# Patient Record
Sex: Female | Born: 1975 | Race: White | Hispanic: Yes | Marital: Married | Smoking: Never smoker
Health system: Southern US, Community
[De-identification: ages and names within clinical notes are randomized; demographics above are authoritative.]

## PROBLEM LIST (undated history)

## (undated) DIAGNOSIS — I10 Essential (primary) hypertension: Secondary | ICD-10-CM

## (undated) DIAGNOSIS — N83209 Unspecified ovarian cyst, unspecified side: Secondary | ICD-10-CM

## (undated) HISTORY — PX: NO PAST SURGERIES: SHX2092

---

## 2005-01-17 ENCOUNTER — Other Ambulatory Visit: Admission: RE | Admit: 2005-01-17 | Discharge: 2005-01-17 | Payer: Self-pay | Admitting: Obstetrics and Gynecology

## 2005-10-12 ENCOUNTER — Inpatient Hospital Stay (HOSPITAL_COMMUNITY): Admission: AD | Admit: 2005-10-12 | Discharge: 2005-10-14 | Payer: Self-pay | Admitting: Gynecology

## 2005-10-12 ENCOUNTER — Ambulatory Visit: Payer: Self-pay | Admitting: Certified Nurse Midwife

## 2011-09-24 ENCOUNTER — Ambulatory Visit: Payer: Self-pay | Admitting: Family Medicine

## 2011-09-24 VITALS — BP 147/88 | HR 71 | Temp 98.6°F | Resp 16 | Ht 63.25 in | Wt 179.4 lb

## 2011-09-24 DIAGNOSIS — I10 Essential (primary) hypertension: Secondary | ICD-10-CM

## 2011-09-24 DIAGNOSIS — R42 Dizziness and giddiness: Secondary | ICD-10-CM

## 2011-09-24 DIAGNOSIS — R112 Nausea with vomiting, unspecified: Secondary | ICD-10-CM

## 2011-09-24 DIAGNOSIS — R110 Nausea: Secondary | ICD-10-CM

## 2011-09-24 DIAGNOSIS — R51 Headache: Secondary | ICD-10-CM

## 2011-09-24 DIAGNOSIS — R635 Abnormal weight gain: Secondary | ICD-10-CM

## 2011-09-24 LAB — POCT CBC
Granulocyte percent: 65.5 %G (ref 37–80)
Lymph, poc: 2.4 (ref 0.6–3.4)
MCHC: 33.8 g/dL (ref 31.8–35.4)
MID (cbc): 0.6 (ref 0–0.9)
POC Granulocyte: 5.7 (ref 2–6.9)
POC LYMPH PERCENT: 27.3 %L (ref 10–50)
Platelet Count, POC: 252 10*3/uL (ref 142–424)
RDW, POC: 13.2 %

## 2011-09-24 LAB — POCT URINE PREGNANCY: Preg Test, Ur: NEGATIVE

## 2011-09-24 MED ORDER — HYDROCHLOROTHIAZIDE 12.5 MG PO CAPS: 12.5000 mg | ORAL_CAPSULE | Freq: Every day | ORAL | Status: DC

## 2011-09-24 NOTE — Progress Notes (Signed)
Subjective:    Patient ID: Kristen Madden, female    DOB: Apr 06, 1976, 36 y.o.   MRN: [id number]  HPI Kristen Madden is a 36 y.o. female Hx of HtN - last seen here in 2010.   Had normal BP's at other clinic on meds, and lost weight- BP was better, now weight has gone up 20 pounds past few months.  No known hx thyroid dz. - had checked a year ago at other office. Today c/o nuasea and dizziness since 1:30 today -  Resolved at 3pm.  Now c/o slight headache top of head and in back of head. Similar HA in past, mild HA. No vision changes, no vomiting, no weakness/slurred speech/facial droop.  Took 3 pills 2 days ago for vaginal irritation/infection/fungus. No f/c.   2-3 weeks ago had chest pain - center chest to back, SOB at the time, but no N/V/diaphoresis.  Sx's resolved in 10-15 minutes after taking Advil.   No recent chest pain.   Irregular periods  -always.  LMP 2/20, and started with menses today. BP high at other clinic few weeks ago.     No known FH CAD.  Review of Systems  Constitutional: Negative for fever and chills.  Cardiovascular: Positive for chest pain.       2 weeks ago - had episode of chest pain.  Genitourinary: Positive for vaginal bleeding.  Neurological: Positive for dizziness.  Psychiatric/Behavioral: Positive for sleep disturbance.       Has been staying up with husband and son due to illnesses past few weeks. Hx of depression in 2007 - denies currently - just tired.         Objective:   Physical Exam  Constitutional: She is oriented to person, place, and time. She appears well-developed and well-nourished.  HENT:  Head: Normocephalic and atraumatic.  Mouth/Throat: No oropharyngeal exudate.       No nystagmus  Eyes: Conjunctivae and EOM are normal. Pupils are equal, round, and reactive to light.  Neck: No thyromegaly present.  Cardiovascular: Normal rate, regular rhythm, normal heart sounds and intact distal pulses.  Exam reveals no gallop.   No  murmur heard. Pulmonary/Chest: Effort normal. She has no wheezes. She has no rales.  Lymphadenopathy:    She has no cervical adenopathy.  Neurological: She is alert and oriented to person, place, and time. She has normal strength. She is not disoriented. No sensory deficit. She displays a negative Romberg sign. Coordination and gait normal.       Normal heel to toe, nonfocal neuro exam.  Skin: Skin is warm and dry. No rash noted.  Psychiatric: She has a normal mood and affect. Her behavior is normal.   EKG: SR, no acute findings.    Results for orders placed in visit on 09/24/11  GLUCOSE, POCT (MANUAL RESULT ENTRY)      Component Value Range   POC Glucose 94    POCT CBC      Component Value Range   WBC 8.7  4.6 - 10.2 (K/uL)   Lymph, poc 2.4  0.6 - 3.4    POC LYMPH PERCENT 27.3  10 - 50 (%L)   MID (cbc) 0.6  0 - 0.9    POC MID % 7.2  0 - 12 (%M)   POC Granulocyte 5.7  2 - 6.9    Granulocyte percent 65.5  37 - 80 (%G)   RBC 4.35  4.04 - 5.48 (M/uL)   Hemoglobin 13.5  12.2 - 16.2 (g/dL)  HCT, POC 40.0  37.7 - 47.9 (%)   MCV 91.9  80 - 97 (fL)   MCH, POC 31.0  27 - 31.2 (pg)   MCHC 33.8  31.8 - 35.4 (g/dL)   RDW, POC 40.9     Platelet Count, POC 252  142 - 424 (K/uL)   MPV 12.1  0 - 99.8 (fL)  POCT URINE PREGNANCY      Component Value Range   Preg Test, Ur Negative         Assessment & Plan:  Kristen Madden is a 36 y.o. female 1. Nausea  POCT glucose (manual entry), POCT CBC, POCT urine pregnancy  2. Dizziness  POCT glucose (manual entry), POCT CBC, TSH, Basic metabolic panel, POCT urine pregnancy, EKG 12-Lead  3. HTN (hypertension)  Basic metabolic panel, EKG 12-Lead  4. Weight gain  TSH   Hx of HTN - recurred with weight gain, prior off meds.  Hx of fleeting sensation of chest pain 2-3 weeks ago without recurrence, and episode of nausea and dizziness earlier today with slight residual HA. Reassuring exam and labs and nonfocal neuro exam, along with improvement in  symptoms - CT deferred at this point.  If any increase in headache, dizziness or nausea, rtc or ER.  If any chest pain, 911/ER eval.  Check TSH and BMP with weight gain and HTN.  Will restart hctz 12.5mg  QD - # 30, 1 rf, recheck next 6 weeks.  orthostatic precautions discussed.    Discussed above in english and spanish, understanding expressed.

## 2011-09-24 NOTE — Patient Instructions (Addendum)
Si su simptomas de dolor en pecho regrese, va a cuarto de emergencia o lllama 911 en telefono.  regrese a Event organiser en 6 semanas por su presion.  Si  tiene Optometrist, regrese mas temprano o cuarto de Associate Professor.

## 2011-09-25 LAB — BASIC METABOLIC PANEL
Calcium: 9.4 mg/dL (ref 8.4–10.5)
Creat: 0.54 mg/dL (ref 0.50–1.10)
Glucose, Bld: 91 mg/dL (ref 70–99)
Sodium: 142 mEq/L (ref 135–145)

## 2011-09-25 LAB — TSH: TSH: 1.65 u[IU]/mL (ref 0.350–4.500)

## 2011-11-17 ENCOUNTER — Encounter: Payer: Self-pay | Admitting: Family Medicine

## 2011-11-17 ENCOUNTER — Ambulatory Visit: Payer: Self-pay | Admitting: Family Medicine

## 2011-11-17 VITALS — BP 147/85 | HR 73 | Temp 98.2°F | Resp 16 | Ht 63.5 in | Wt 177.2 lb

## 2011-11-17 DIAGNOSIS — L282 Other prurigo: Secondary | ICD-10-CM

## 2011-11-17 DIAGNOSIS — R300 Dysuria: Secondary | ICD-10-CM

## 2011-11-17 DIAGNOSIS — B9689 Other specified bacterial agents as the cause of diseases classified elsewhere: Secondary | ICD-10-CM

## 2011-11-17 DIAGNOSIS — I10 Essential (primary) hypertension: Secondary | ICD-10-CM

## 2011-11-17 DIAGNOSIS — N760 Acute vaginitis: Secondary | ICD-10-CM

## 2011-11-17 DIAGNOSIS — N898 Other specified noninflammatory disorders of vagina: Secondary | ICD-10-CM

## 2011-11-17 LAB — POCT URINALYSIS DIPSTICK
Blood, UA: NEGATIVE
Glucose, UA: NEGATIVE
Nitrite, UA: NEGATIVE
Protein, UA: NEGATIVE
Spec Grav, UA: <=1.005
Urobilinogen, UA: 0.2

## 2011-11-17 LAB — POCT UA - MICROSCOPIC ONLY
Crystals, Ur, HPF, POC: NEGATIVE
Yeast, UA: NEGATIVE

## 2011-11-17 LAB — POCT WET PREP WITH KOH
KOH Prep POC: NEGATIVE
Trichomonas, UA: NEGATIVE

## 2011-11-17 MED ORDER — HYDROCHLOROTHIAZIDE 25 MG PO TABS: 25.0000 mg | ORAL_TABLET | Freq: Every day | ORAL | Status: DC

## 2011-11-17 MED ORDER — TRIAMCINOLONE ACETONIDE 0.1 % EX CREA: TOPICAL_CREAM | Freq: Two times a day (BID) | CUTANEOUS | Status: AC

## 2011-11-17 MED ORDER — METRONIDAZOLE 500 MG PO TABS: ORAL_TABLET | ORAL | Status: DC

## 2011-11-17 NOTE — Patient Instructions (Signed)
Dove soap.  No mas vinegar. nuevo medicina for su presion.  regresa en una mes, mas temprano si necesario.   Vaginosis bacteriana (Bacterial Vaginosis) La vaginosis bacteriana es una infeccin vaginal en la que el equilibrio normal de las bacterias de la vagina se modifica. Este equilibrio normal se ve afectado por un desarrollo excesivo de ciertas bacterias. Hay diferentes tipos de bacteria que causan la vaginosis bacteriana. Es el problema vaginal ms comn en las mujeres de edad frtil. CAUSAS  La causa de este trastorno no se conoce bien. Se produce como consecuencia de un aumento o desequilibrio de las bacterias nocivas.   Algunas actividades o conductas pueden poner en peligro el equilibrio normal de las bacterias en la vagina, y Astronomer. Entre ellas:   Tener un compaero sexual o mltiples compaeros sexuales.   Las duchas vaginales   Usar un dispositivo intrauterino (DIU) como mtodo anticonceptivo.   No se conoce el papel que juega la actividad sexual en el desarrollo de [place] VB. Sin embargo, las mujeres que nunca tuvieron relaciones sexuales raramente se infectan.  El contagio no se produce en asientos de baos, camas, piscinas o por tocar objetos.  SNTOMAS  Flujo vaginal grisceo.   Olor parecido al pescado con la secrecin, en especial despus de Management consultant.   Picazn o irritacin de la vagina y la vulva.   Ardor o dolor al [employer].   Algunas mujeres no presentan ningn sntoma.  DIAGNSTICO El mdico realizar un examen vaginal para diagnosticar una vaginosis bacteriana. El mdico le indicar anlisis de laboratorio y observar las muestras del lquido vaginal en el microscopio. Buscar bacterias y clulas anormales (clulas clave), pH mayor a 4.5 y [place] prueba de aminas positivo, todos ellos asociados al BV.  RIESGOS Y COMPLICACIONES  Enfermedad plvica inflamatoria (EPI).   Infecciones luego de una ciruga ginecolgica.   VIH.   Virus  del Herpes  TRATAMIENTO En algunos casos, la infeccin desaparece sin tratamiento. Sin embargo, todas las mujeres con sntomas de VB deben tratarse para evitar complicaciones, especialmente si se ha planificado una ciruga ginecolgica. Los compaeros varones generalmente no necesitan tratamiento. Sin embargo, puede contagiarse entre parejas femeninas, de modo que el tratamiento se realiza para Dietitian.   La VB puede tratarse con medicamentos que destruyen grmenes (antibiticos). Estos se presentan en pldoras o en cremas vaginales. Tanto mujeres embarazadas como no embarazadas pueden usar ambos, pero se indican en dosis diferentes. Estos antibiticos no daan al beb.   La VB puede recurrir [employer]. Si esto ocurre, se prescribir un segundo tratamiento con antibiticos.   El tratamiento es importante en el caso de las mujeres Cub[place]. Si no se trata, la VB puede causar [employer], especialmente en [employer] que ha tenido un parto prematuro en el pasado. Todas las mujeres embarazadas que tienen sntomas de VB deben ser controladas y tratadas.   En los casos de recurrencia crnica, se prescribe un tratamiento con un gel vaginal dos veces por semana  INSTRUCCIONES PARA EL CUIDADO DOMICILIARIO  Tome los medicamentos que le indic el mdico.   No mantenga relaciones sexuales Librarian, academic.   Comunique a sus compaeros sexuales que sufre una infeccin vaginal. Ellos deben concurrir para un control mdico si tienen problemas como una urticaria leve o picazn.   Practique el sexo seguro. Use preservativos. Tenga un solo compaero sexual.  PREVENCIN Algunos pasos bsicos de prevencin pueden ayudar a reducir Nurse, adult de desequilibrio de las bacterias vaginales  y de sufrir VB.  No mantener relaciones sexuales (abstinencia)   No utilice duchas vaginales.   Utilice todos los [employer] han prescripto para el [place], aunque los  sntomas hayan desaparecido.   Comunique a su compaero sexual que sufre una VB. De ese modo podr tratase, si es necesario, y podr Economist.  SOLICITE ATENCIN MDICA SI:  Los sntomas no mejoran luego de [address]  [address] de [place].   Aumentan la secrecin, el dolor o la fiebre.  ASEGRESE QUE:   Comprende estas instrucciones.   Controlar su enfermedad.   Solicitar ayuda de inmediato si no mejora o empeora.  PARA MS INFORMACIN: Division de STD Prevention (DSTDP), Centers for Disease Control and Prevention (Centros para el control y la prevencin de enfermedades, CDC): SolutionApps.co.za American Social Health Association (ASHA): www.ashastd.org  Document Released: 09/30/2007 Document Revised: 06/12/2011 [facility] Patient Information 2012 [place], [place].

## 2011-11-17 NOTE — Progress Notes (Signed)
Subjective:    Patient ID: Jefm Bryant, female    DOB: [birth year], 36 y.o.   MRN: [id number]  HPI AMONI SCALLAN is a 36 y.o. female  HTN - see 09/24/11 office visit.  Restarted hctz 12.5mg  QD - # 30, 1 rf. TSH and creatinine WNL at last ov.  No side effects with meds. No outside BP's.  Denies use of salt, does not eat restaurant food or frozen foods.    Vaginal discharge - green discharge,  For past 6 months.  Sometimes white, sometimes green.  Itching now.  LMP - 8 days go.  No abd/pelvic pain. No urinary difficulty.  Dr. Quintella Reichert gave medicine, but it didn;t work.  Had diflucan 150mg  x 6 days and nystatin in December 2012, no improvement..  Albendazole 200mg  #6 rx in March,   2 other visits - had nystatin cream rx. No apparent improvement with these treatments. Uses water and white vinegar to cleanse genital area.  No other soap.    Pap normal last November.  Itching on arms for few weeks, use cortisone occasionally - helps, but comes back.  Takes Production assistant, radio.   Review of Systems  Constitutional: Negative for fever and chills.  Respiratory: Negative for shortness of breath.   Cardiovascular: Negative for chest pain.  Gastrointestinal: Negative for nausea and abdominal pain.  Genitourinary: Positive for vaginal discharge. Negative for urgency, hematuria, vaginal bleeding, difficulty urinating, vaginal pain and pelvic pain.  Neurological: Negative for dizziness.       Objective:   Physical Exam  Constitutional: She is oriented to person, place, and time. She appears well-developed and well-nourished.  HENT:  Head: Atraumatic.  Cardiovascular: Normal rate, regular rhythm, normal heart sounds and intact distal pulses.   No murmur heard. Pulmonary/Chest: Effort normal and breath sounds normal.  Abdominal: Soft. She exhibits no distension. There is no tenderness. There is no rebound and no guarding.  Genitourinary: Uterus normal. There is no rash on the right labia.  There is no rash on the left labia. Cervix exhibits no motion tenderness. Right adnexum displays no tenderness. Left adnexum displays no tenderness. No erythema, tenderness or bleeding around the vagina. No foreign body around the vagina. Vaginal discharge found.       Small amt white discharge in vault.  Neurological: She is alert and oriented to person, place, and time.  Skin: Skin is warm and dry.     Psychiatric: She has a normal mood and affect. Her behavior is normal.   Results for orders placed in visit on 11/17/11  POCT URINALYSIS DIPSTICK      Component Value Range   Color, UA yellow     Clarity, UA clear     Glucose, UA neg     Bilirubin, UA neg     Ketones, UA neg     Spec Grav, UA <=1.005     Blood, UA neg     pH, UA 6.5     Protein, UA neg     Urobilinogen, UA 0.2     Nitrite, UA neg     Leukocytes, UA Negative    POCT UA - MICROSCOPIC ONLY      Component Value Range   WBC, Ur, HPF, POC 0-1     RBC, urine, microscopic 0-1     Bacteria, U Microscopic neg     Mucus, UA neg     Epithelial cells, urine per micros 0-2     Crystals, Ur, HPF, POC neg  Casts, Ur, LPF, POC neg     Yeast, UA neg    POCT WET PREP WITH KOH      Component Value Range   Trichomonas, UA Negative     Clue Cells Wet Prep HPF POC 4-6     Epithelial Wet Prep HPF POC 5-8     Yeast Wet Prep HPF POC NEG     Bacteria Wet Prep HPF POC 2+     RBC Wet Prep HPF POC 3-6     WBC Wet Prep HPF POC 18-20     KOH Prep POC Negative          Assessment & Plan:  AYMEE FOMBY is a 36 y.o. female HTn - uncontrolled. Increase HCTZ to 25mg  qd.  Orthostatic precautions.  Check outside BP's.  If any new se's at this dose, return to 12.5mg .  Recheck in 1 month.  Vaginal discharge - recurrent - likely Bacterial vaginosis, advised to stop vinegar cleanses.  Dove soap only and water.  Flagyl 150mg  BID #14.    Rash - arms, contact derm vs eczema. Cont allegra otc.  Dove soap.  Triamcinolone.0.1% BID  top prn.  REcheck next few weeks if not improving.  Language barrier - spanish and english spoken.  Understanding expressed.

## 2011-11-24 ENCOUNTER — Ambulatory Visit: Payer: Self-pay | Admitting: Family Medicine

## 2011-11-24 VITALS — BP 144/88 | HR 64 | Temp 98.3°F | Resp 16 | Ht 64.0 in | Wt 180.0 lb

## 2011-11-24 DIAGNOSIS — I10 Essential (primary) hypertension: Secondary | ICD-10-CM

## 2011-11-24 DIAGNOSIS — N760 Acute vaginitis: Secondary | ICD-10-CM

## 2011-11-24 LAB — POCT WET PREP WITH KOH
KOH Prep POC: NEGATIVE
Trichomonas, UA: NEGATIVE
Yeast Wet Prep HPF POC: NEGATIVE

## 2011-11-24 NOTE — Progress Notes (Signed)
Patient Name: Kristen Madden Date of Birth: May 29, 1976 Medical Record Number: [id number] Gender: female Date of Encounter: 11/24/2011  History of Present Illness:  Kristen Madden is a 36 y.o. very pleasant female patient who presents with the following:  Here today to recheck her BP and ?associated SE of recent increase of her HCTZ from 12.5 to 25mg .  She notes that she had some headache last week.  She did not check her BP while she was on the increased dose.  She decided to go back down on her HCTZ to a half pill again the last couple of days and feels better.  She has also been taking flagyl for BV over the last week.  She still feels some vaginal discomfort and is not sure if she needs to take another course of flagyl, although her symptoms are indeed improved LMP 11/09/11  There is no problem list on file for this patient.  No past medical history on file. No past surgical history on file. History  Substance Use Topics  . Smoking status: Never Smoker   . Smokeless tobacco: Not on file  . Alcohol Use: Not on file   No family history on file. No Known Allergies  Medication list has been reviewed and updated.  Review of Systems: As per HPI- otherwise negative.   Physical Examination: Filed Vitals:   11/24/11 1042  BP: 144/88  Pulse: 64  Temp: 98.3 F (36.8 C)  TempSrc: Oral  Resp: 16  Height: 5\' 4"  (1.626 m)  Weight: 180 lb (81.647 kg)  SpO2: 100%    Body mass index is 30.90 kg/(m^2).  GEN: WDWN, NAD, Non-toxic, A & O x 3, overweight HEENT: Atraumatic, Normocephalic. Neck supple. No masses, No LAD.  PEERL, TM and oropharynx wnl Ears and Nose: No external deformity. CV: RRR, No M/G/R. No JVD. No thrill. No extra heart sounds. PULM: CTA B, no wheezes, crackles, rhonchi. No retractions. No resp. distress. No accessory muscle use. EXTR: No c/c/e NEURO Normal gait.  PSYCH: Normally interactive. Conversant. Not depressed or anxious appearing.  Calm  demeanor.   Results for orders placed in visit on 11/24/11  POCT WET PREP WITH KOH      Component Value Range   Trichomonas, UA Negative     Clue Cells Wet Prep HPF POC 0-2     Epithelial Wet Prep HPF POC 2-4     Yeast Wet Prep HPF POC neg     Bacteria Wet Prep HPF POC small     RBC Wet Prep HPF POC 0-1     WBC Wet Prep HPF POC 4-8     KOH Prep POC Negative     Office Visit on 11/17/2011  Component Date Value Range Status  . Color, UA  11/17/2011 yellow   Final  . Clarity, UA  11/17/2011 clear   Final  . Glucose, UA  11/17/2011 neg   Final  . Bilirubin, UA  11/17/2011 neg   Final  . Ketones, UA  11/17/2011 neg   Final  . Spec Grav, UA  11/17/2011 <=1.005   Final  . Blood, UA  11/17/2011 neg   Final  . pH, UA  11/17/2011 6.5   Final  . Protein, UA  11/17/2011 neg   Final  . Urobilinogen, UA  11/17/2011 0.2   Final  . Nitrite, UA  11/17/2011 neg   Final  . Leukocytes, UA  11/17/2011 Negative   Final  . WBC, Ur, HPF, POC  11/17/2011 0-1  Final  . RBC, urine, microscopic  11/17/2011 0-1   Final  . Bacteria, U Microscopic  11/17/2011 neg   Final  . Mucus, UA  11/17/2011 neg   Final  . Epithelial cells, urine per micros  11/17/2011 0-2   Final  . Crystals, Ur, HPF, POC  11/17/2011 neg   Final  . Casts, Ur, LPF, POC  11/17/2011 neg   Final  . Yeast, UA  11/17/2011 neg   Final  . Trichomonas, UA  11/17/2011 Negative   Final  . Clue Cells Wet Prep HPF POC  11/17/2011 4-6   Final  . Epithelial Wet Prep HPF POC  11/17/2011 5-8   Final  . Yeast Wet Prep HPF POC  11/17/2011 NEG   Final  . Bacteria Wet Prep HPF POC  11/17/2011 2+   Final  . RBC Wet Prep HPF POC  11/17/2011 3-6   Final  . WBC Wet Prep HPF POC  11/17/2011 18-20   Final  . KOH Prep POC  11/17/2011 Negative   Final    Assessment and Plan: 1. Vaginitis  POCT Wet Prep with KOH  reassured her that her clue cells are much decreased from her last visit. It may take a little time for her symptoms to totally resolve, but I do  not think repeating her flagyl is necessary.  Also suspect that her symptoms last week have been due to the flagyl, no to increasing her HCTZ.  Pointed out that her BP is still elevated and that she likely does need to increase her HCTZ to 25 mg- she will try this again and let us know how she does.  Plan follow-up in about one month.

## 2011-12-14 ENCOUNTER — Ambulatory Visit: Payer: Self-pay | Admitting: Family Medicine

## 2011-12-14 VITALS — BP 139/74 | HR 69 | Temp 98.3°F | Resp 18 | Ht 63.5 in | Wt 177.0 lb

## 2011-12-14 DIAGNOSIS — N760 Acute vaginitis: Secondary | ICD-10-CM

## 2011-12-14 DIAGNOSIS — I10 Essential (primary) hypertension: Secondary | ICD-10-CM

## 2011-12-14 DIAGNOSIS — L253 Unspecified contact dermatitis due to other chemical products: Secondary | ICD-10-CM

## 2011-12-14 DIAGNOSIS — L259 Unspecified contact dermatitis, unspecified cause: Secondary | ICD-10-CM

## 2011-12-14 LAB — POCT WET PREP WITH KOH
Trichomonas, UA: NEGATIVE
Yeast Wet Prep HPF POC: NEGATIVE

## 2011-12-14 MED ORDER — CETIRIZINE HCL 10 MG PO TABS: 10.0000 mg | ORAL_TABLET | Freq: Every day | ORAL | Status: DC

## 2011-12-14 MED ORDER — HYDROCHLOROTHIAZIDE 25 MG PO TABS: 25.0000 mg | ORAL_TABLET | Freq: Every day | ORAL | Status: DC

## 2011-12-14 NOTE — Progress Notes (Signed)
?   Subjective:    Patient ID: Kristen Madden, female    DOB: Sep 15, 1975, 36 y.o.   MRN: [id number]  HPI Kristen Madden is a 36 y.o. female See prior ov's . May 13th history summary:   "vaginal discharge - green discharge,  For past 6 months.  Sometimes white, sometimes green.  Itching now.  LMP - 8 days go.  No abd/pelvic pain. No urinary difficulty.  Dr. Quintella Reichert gave medicine, but it didn't work.  Had diflucan 150mg  x 6 days and nystatin in December 2012, no improvement..  Albendazole 200mg  #6 rx in March,   2 other visits - had nystatin cream rx. No apparent improvement with these treatments. Uses water and white vinegar to cleanse genital area.  No other soap.    Pap normal last November. Wet prep on 5/13:  4-6 clue cells with 18-20 WBC's, negative trich, negative yeast. Diagnosed with bacterial vaginosis,advised to stop vinegar cleanses.  Dove soap only and water.  Flagyl 150mg  BID #14.   Seen in follow up by Dr. Patsy Lager -  May 20th - 0 to 2 clue cells.  No repeat in flagyl rx.  Today - feels better, but still some itching/burning at times in vagina.  Dove soap.  No further vinegar.    Rash is back on arms.  using cream twice per day.  Improved, went away, then came back past 3 days.   Cream helps some with itching.  No known new chemical exposure, but using soap to clean in kitchen.  No gloves.    HTN - outside bp's 115/77, 124/79. On HCTZ 25mg  qd. Creat 0.54 09/24/11   Review of Systems Rash - arms. And as per hpi.    Objective:   Physical Exam  Constitutional: She appears well-developed and well-nourished.  HENT:  Head: Normocephalic and atraumatic.  Cardiovascular: Normal rate, regular rhythm, normal heart sounds and intact distal pulses.   Pulmonary/Chest: Effort normal and breath sounds normal.  Skin: Skin is warm and dry. Rash noted.          Erythematous patches dorsum of distal R forearm and undersurface of distal L forearm.   No hand lesions currently    Psychiatric: She has a normal mood and affect. Her behavior is normal.    Results for orders placed in visit on 12/14/11  POCT WET PREP WITH KOH      Component Value Range   Trichomonas, UA Negative     Clue Cells Wet Prep HPF POC 0-1     Epithelial Wet Prep HPF POC 2-4     Yeast Wet Prep HPF POC negative     Bacteria Wet Prep HPF POC small     RBC Wet Prep HPF POC 0-1     WBC Wet Prep HPF POC 3-5     KOH Prep POC Negative         Assessment & Plan:   Bacterial vaginosis history,   With intermittent itching/burning.  Improved. Would hold on repeat of flagyl as improved, Check gen probe, continue dove soap only, and if still symptomatic in 3- 4 weeks, consider retreatment then. Understanding expressed.  HTN - stable, controlled. meds refilled x 3 months, then needs recheck for BMP.  Contact derm - add zyrtec 10mg  qd, cont tac 0.1% BID prn , and wear gloves with any chemicals.  At end of visit - asked if ok to become pregnant - advised to start otc PNV QD.

## 2011-12-14 NOTE — Patient Instructions (Addendum)
Your blood pressure is better.  Continue same medicine and recheck in 3 months. The test for itching/burning was better.  No new medicine for now.  Recheck in 3-4 weeks if still having symptoms, sooner if worse..  Your should receive a call or letter about your lab results within the next week to 10 days.    Dermatitis de contacto (Contact Dermatitis) La dermatitis de contacto es una reaccin a ciertas sustancias que tocan la piel. Puede ser Neomia Dear dermatitis de contacto irritante o alrgica. La dermatitis de contacto irritante no requiere exposicin previa a la sustancia que provoc la reaccin.La dermatitis alrgica slo ocurre si ha estado expuesto anteriormente a la sustancia. Al repetir la exposicin, el organismo reacciona a la sustancia.  CAUSAS  Muchas sustancias pueden causar dermatitis de contacto. La dermatitis irritante se produce cuando hay exposicin repetida a sustancias levemente irritantes, como por ejemplo:   Maquillaje.   Jabones.   Detergentes.   Lavandina.   cidos.   Sales metlicas, como el nquel.  Las causas de la dermatitis alrgica son:   Plantas venenosas.   Sustancias qumicas (desodorantes, champs).   Bijouterie.   Ltex.   Neomicina en cremas con triple antibitico.   Conservantes en productos incluyendo en la ropa.  SNTOMAS  En la zona de la piel que ha estado expuesta puede haber:   Sequedad o descamacin.   Enrojecimiento.   Grietas.   Picazn.   Dolor o sensacin de ardor.   Ampollas.  En el caso de la dermatitis de Engineer, technical sales, puede haber slo hinchazn en algunas zonas, como la boca o los genitales.  DIAGNSTICO  El mdico podr hacer el diagnstico realizando un examen fsico. En los casos en que la causa es incierta y se sospecha una dermatitis de [place], le har una prueba en la piel con un parche para determinar la causa de la dermatitis. TRATAMIENTO  El tratamiento incluye la proteccin de la piel de nuevos  contactos con la sustancia irritante, evitando la sustancia en lo posible. Puede ser de utilidad colocar una barrera como cremas, polvos y [place]. El mdico tambin podr recomendar:   Cremas o pomadas con corticoides aplicadas 2 veces por da. Para un mejor efecto, humedezca la zona con agua fresca durante 20 minutos. Luego aplique el medicamento. Cubra la zona con un vendaje plstico. Puede almacenar la crema con corticoides en el refrigerador para Garment/textile technologist "refrescante" sobre la erupcin que har aliviar la picazn. Esto aliviar la picazn. En los casos ms graves ser necesario aplicar corticoides por va oral.   Ungentos con antibiticos o antibacterianos, si hay una infeccin en la piel.   Antihistamnicos en forma de locin o por va oral para calmar la picazn.   Lubricantes para mantener la humectacin de la piel.   La solucin de [place] para reducir el enrojecimiento y Chief Technology Officer o para secar una erupcin que supura. Mezcle un paquete o tableta en dos tazas de agua fra. Moje un pao limpio en la solucin, escrralo un poco y colquelo en el rea afectada. Djelo en el lugar durante 30 minutos. Repita el procedimiento todas las veces que pueda a lo largo del Futures trader.   Hgase baos con almidn o bicarbonato todos los [address]  [address] si la zona es demasiado extensa como para cubrirla con una toallita.  Algunas sustancias qumicas, como los lcalis o los cidos pueden daar la piel del mismo modo que [place]. Enjuague la piel durante 15 a 20 minutos con agua fra despus de  la exposicin a esas sustancias. Tambin busque atencin mdica de inmediato. En los casos de piel muy irritada, ser necesario aplicar (vendajes), antibiticos y analgsicos.  INSTRUCCIONES PARA EL CUIDADO EN EL HOGAR   Evite lo que ha causado la erupcin.   Mantenga el rea de la piel afectada sin contacto con el agua caliente, el jabn, la luz solar, las sustancias qumicas, sustancias cidas o todo lo que la irrite.     No se rasque la lesin. El rascado puede hacer que la erupcin se infecte.   Puede tomar baos con agua fresca para detener la picazn.   Tome slo medicamentos de venta libre o recetados, segn las indicaciones del mdico.   Oceanographer a las visitas de control segn las indicaciones, para asegurarse de que la piel se est curando Audiological scientist.  SOLICITE ATENCIN MDICA SI:   El problema no mejora luego de [address]  [address] de [place].   Se siente empeorar.   Observa signos de infeccin, como hinchazn, sensibilidad, inflamacin, enrojecimiento o aumenta la temperatura en la zona afectada.   Tiene nuevos problemas debido a los medicamentos.  Document Released: 04/02/2005 Document Revised: 06/12/2011 [facility] Patient Information 2012 [place], [place].

## 2011-12-15 LAB — GC/CHLAMYDIA PROBE AMP, GENITAL: GC Probe Amp, Genital: NEGATIVE

## 2012-03-04 ENCOUNTER — Ambulatory Visit (INDEPENDENT_AMBULATORY_CARE_PROVIDER_SITE_OTHER): Payer: Self-pay | Admitting: Family Medicine

## 2012-03-04 VITALS — BP 128/84 | HR 74 | Temp 98.1°F | Resp 14 | Ht 63.5 in | Wt 177.6 lb

## 2012-03-04 DIAGNOSIS — I10 Essential (primary) hypertension: Secondary | ICD-10-CM

## 2012-03-04 DIAGNOSIS — Z719 Counseling, unspecified: Secondary | ICD-10-CM

## 2012-03-04 LAB — BASIC METABOLIC PANEL
CO2: 26 mEq/L (ref 19–32)
Calcium: 10.2 mg/dL (ref 8.4–10.5)
Glucose, Bld: 95 mg/dL (ref 70–99)
Potassium: 4 mEq/L (ref 3.5–5.3)
Sodium: 139 mEq/L (ref 135–145)

## 2012-03-04 MED ORDER — HYDROCHLOROTHIAZIDE 25 MG PO TABS: 25.0000 mg | ORAL_TABLET | Freq: Every day | ORAL | Status: DC

## 2012-03-04 NOTE — Progress Notes (Signed)
  Subjective:    Patient ID: Kristen Madden, female    DOB: [birth year], 36 y.o.   MRN: [id number]  HPI Kristen Madden is a 36 y.o. female  HTN - no new side effects of meds.  No lightheadedness.  Outside bp's - 111/77, 124/80, 133/83.    Occasional hot feeling in neck and low back.   No fever, no injury. No further vaginal discharge.   Irregular menses, but LMP 2nd of this month - normal amount. Irregular menses - would like to become pregnant - but just this month is trying. Had to take medicines last time to become pregnant. Would like to wait few months to see if can become pregnant on own.  Is taking pnv QD.  Review of Systems  Constitutional: Negative for fatigue and unexpected weight change.  Respiratory: Negative for chest tightness and shortness of breath.   Cardiovascular: Negative for chest pain, palpitations and leg swelling.  Gastrointestinal: Negative for abdominal pain and blood in stool.  Neurological: Negative for dizziness, syncope, light-headedness and headaches.       Objective:   Physical Exam  Constitutional: She is oriented to person, place, and time. She appears well-developed and well-nourished.  HENT:  Head: Normocephalic and atraumatic.  Eyes: Conjunctivae and EOM are normal. Pupils are equal, round, and reactive to light.  Neck: Carotid bruit is not present.  Cardiovascular: Normal rate, regular rhythm, normal heart sounds and intact distal pulses.   Pulmonary/Chest: Effort normal and breath sounds normal.  Abdominal: Soft. She exhibits no pulsatile midline mass. There is no tenderness.  Neurological: She is alert and oriented to person, place, and time.  Skin: Skin is warm and dry.  Psychiatric: She has a normal mood and affect. Her behavior is normal.       Assessment & Plan:  [name] is a 36 y.o. female 1. HTN (hypertension)  Basic metabolic panel, hydrochlorothiazide (HYDRODIURIL) 25 MG tablet  2. Counseling NOS       BP stable.  Cont same doses of meds at this point.  Check BMP.  If becomes pregnant - will need to possibly adjust HCTZ dosing. Recheck in next 6 months.   Counseling - trying to become pregnant for past 1 month only. On PNV.  Required meds in past.  Consider referral to obgyn in next 6 months.  Spanish spoken - understanding expressed.

## 2012-03-04 NOTE — Patient Instructions (Signed)
regrese en 6 meses - mas temprano si necesario.si embarazad - es necesario hablar de sus medicinas.

## 2012-06-11 ENCOUNTER — Encounter: Payer: Self-pay | Admitting: Physician Assistant

## 2012-06-11 ENCOUNTER — Ambulatory Visit: Payer: Self-pay | Admitting: Physician Assistant

## 2012-06-11 VITALS — BP 124/84 | HR 76 | Temp 98.1°F | Resp 16 | Ht 63.0 in | Wt 182.4 lb

## 2012-06-11 DIAGNOSIS — I10 Essential (primary) hypertension: Secondary | ICD-10-CM

## 2012-06-11 NOTE — Progress Notes (Signed)
  Subjective:    Patient ID: Kristen Madden, female    DOB: [birth year], 36 y.o.   MRN: [id number]  HPI   Kristen Madden is a 36 yr old female here with concern for elevated blood pressure.  She is currently treated with 25mg  HCTZ.  She does not check BP at home, but yesterday at a CPE at an outside clinic her pressure was 142/92.  She states that perhaps she was just nervous.  She denies HA, dizziness, CP, cough, SOB, palpitations, leg swelling.      Review of Systems  Constitutional: Negative for fever and chills.  HENT: Negative.   Respiratory: Negative for cough, shortness of breath and wheezing.   Cardiovascular: Negative for chest pain, palpitations and leg swelling.  Gastrointestinal: Negative.   Musculoskeletal: Negative.   Neurological: Negative.        Objective:   Physical Exam  Vitals reviewed. Constitutional: She is oriented to person, place, and time. She appears well-developed and well-nourished. No distress.  HENT:  Head: Normocephalic and atraumatic.  Cardiovascular: Normal rate, regular rhythm, normal heart sounds and intact distal pulses.  Exam reveals no gallop and no friction rub.   No murmur heard. Pulses:      Radial pulses are 2+ on the right side, and 2+ on the left side.       Posterior tibial pulses are 2+ on the right side, and 2+ on the left side.  Pulmonary/Chest: Effort normal and breath sounds normal. She has no wheezes. She has no rales.  Neurological: She is alert and oriented to person, place, and time.  Skin: Skin is warm and dry.  Psychiatric: She has a normal mood and affect. Her behavior is normal.     Filed Vitals:   06/11/12 0929  BP: 124/84  Pulse: 76  Temp: 98.1 F (36.7 C)  Resp: 16     Re-check BP: 116/78       Assessment & Plan:   1. HTN (hypertension)     Kristen Madden is a very pleasant 36 yr old female with hypertension.  She has concern for an elevated pressure yesterday, but today BP was 124/84  at triage and 116/78 on exam.  Discussed with patient that it's possible she was just nervous yesterday during her physical, and that it is not appropriate to make medication changes based on that one reading.  I encouraged her to continue with her current meds and to check her BP 1-2 times per week.  If readings are still 140s/90s we can discuss changes in therapy.  She will RTC in 3-4 months when she needs refills, sooner if needed.

## 2012-06-11 NOTE — Patient Instructions (Addendum)
Continue taking 25mg  HCTZ.  Check you blood pressure once or twice per week at home.  If your pressures are still 140s/90s we can talk about making a medication change.  Since your pressure is normal today, I do not want to add any medication that will make your blood pressure go too low.   Return in 3-6 months for a recheck.

## 2012-07-07 NOTE — L&D Delivery Note (Signed)
Delivery Note At 3:40 PM a viable female was delivered via Vaginal, Spontaneous Delivery (Presentation: Left Occiput Anterior).  APGAR: 9, 9; weight .   Placenta status: Intact, Spontaneous.  Cord: 2 vessels with the following complications: None.  Cord pH: not done  Anesthesia: None  Episiotomy: None Lacerations: 2nd degree Suture Repair: 2.0 vicryl Est. Blood Loss (mL): 300  Mom to postpartum.  Baby to nursery-stable.  MARSHALL,BERNARD A 02/23/2013, 3:57 PM

## 2012-07-09 ENCOUNTER — Ambulatory Visit: Payer: Self-pay | Admitting: Family Medicine

## 2012-07-09 VITALS — BP 141/85 | HR 77 | Temp 98.5°F | Resp 16 | Ht 64.0 in | Wt 186.0 lb

## 2012-07-09 DIAGNOSIS — Z3201 Encounter for pregnancy test, result positive: Secondary | ICD-10-CM

## 2012-07-09 DIAGNOSIS — Z3200 Encounter for pregnancy test, result unknown: Secondary | ICD-10-CM

## 2012-07-09 DIAGNOSIS — I10 Essential (primary) hypertension: Secondary | ICD-10-CM

## 2012-07-09 LAB — POCT URINE PREGNANCY: Preg Test, Ur: POSITIVE

## 2012-07-09 MED ORDER — LABETALOL HCL 100 MG PO TABS: 100.0000 mg | ORAL_TABLET | Freq: Two times a day (BID) | ORAL | Status: DC

## 2012-07-09 MED ORDER — LABETALOL HCL 100 MG PO TABS: 50.0000 mg | ORAL_TABLET | Freq: Two times a day (BID) | ORAL | Status: DC

## 2012-07-09 NOTE — Progress Notes (Signed)
  Subjective:    Patient ID: Jefm Bryant, female    DOB: Nov 13, 1975, 37 y.o.   MRN: [id number]  HPI ALESSA MAZUR is a 37 y.o. female Hx of HTN  [id number] - 68 year old son.  Has been trying to become pregnant - positive test 07/01/12.  Morning sickness at times, slight breat tenderness. No abd pain or bleeding. Has appt with [facility] 07/15/12 - will find out care provider then.   Taking PNV QD  LNMP 05/28/12 - slightly less than prior.   Review of Systems  Constitutional: Negative for fever and chills.  Respiratory: Negative for chest tightness and shortness of breath.   Cardiovascular: Negative for chest pain.  Gastrointestinal: Positive for nausea.  Genitourinary: Negative for vaginal bleeding and vaginal pain.       Objective:   Physical Exam  Constitutional: She is oriented to person, place, and time. She appears well-developed and well-nourished.  HENT:  Head: Normocephalic and atraumatic.  Eyes: Conjunctivae normal and EOM are normal. Pupils are equal, round, and reactive to light.  Neck: Carotid bruit is not present.  Cardiovascular: Normal rate, regular rhythm, normal heart sounds and intact distal pulses.   Pulmonary/Chest: Effort normal and breath sounds normal.  Abdominal: Soft. She exhibits no pulsatile midline mass. There is no tenderness.  Neurological: She is alert and oriented to person, place, and time.  Skin: Skin is warm and dry.  Psychiatric: She has a normal mood and affect. Her behavior is normal.      Results for orders placed in visit on 07/09/12  POCT URINE PREGNANCY      Component Value Range   Preg Test, Ur Positive         Assessment & Plan:  HENRINE HAYTER is a 37 y.o. female 1. Possible pregnancy, not yet confirmed  POCT urine pregnancy  2. Hypertension     G2P1001 at 6 wks pregnancy (by LMP) with preexisting HTN - will change to labetalol 50mg  BID, stop norvasc.  If greater than systolic 160 - rtc or  discuss with obgyn.  Orthostatic precautions.   Copy of pregnancy test given - follow up at [facility] appt.   Language barrier - spanish spoken - understanding expressed.   Patient Instructions  No mas hydrochlorothiazide.  nueva medicina esta noche - labetalol - un media pastilla dos veces cada dia.  Hable con su doctor obstetrico de [employer].  si tiene mareo, o su presion es mas alta que 160 - regrese a Event organiser.

## 2012-07-09 NOTE — Patient Instructions (Addendum)
No mas hydrochlorothiazide.  nueva medicina esta noche - labetalol - un media pastilla dos veces cada dia.  Hable con su doctor obstetrico de [employer].  si tiene mareo, o su presion es mas alta que 160 - regrese a Event organiser.

## 2012-08-03 LAB — OB RESULTS CONSOLE ANTIBODY SCREEN: Antibody Screen: NEGATIVE

## 2012-08-03 LAB — OB RESULTS CONSOLE ABO/RH: RH Type: POSITIVE

## 2012-08-03 LAB — OB RESULTS CONSOLE RUBELLA ANTIBODY, IGM: Rubella: IMMUNE

## 2012-08-03 LAB — OB RESULTS CONSOLE HIV ANTIBODY (ROUTINE TESTING): HIV: NONREACTIVE

## 2012-08-03 LAB — OB RESULTS CONSOLE GC/CHLAMYDIA: Gonorrhea: NEGATIVE

## 2012-08-03 LAB — OB RESULTS CONSOLE HEPATITIS B SURFACE ANTIGEN: Hepatitis B Surface Ag: NEGATIVE

## 2012-12-21 ENCOUNTER — Other Ambulatory Visit (HOSPITAL_COMMUNITY): Payer: Self-pay | Admitting: Obstetrics

## 2012-12-21 DIAGNOSIS — O09529 Supervision of elderly multigravida, unspecified trimester: Secondary | ICD-10-CM

## 2012-12-24 ENCOUNTER — Ambulatory Visit (HOSPITAL_COMMUNITY)
Admission: RE | Admit: 2012-12-24 | Discharge: 2012-12-24 | Disposition: A | Discharge status: Home / Self Care | Payer: Self-pay | Source: Ambulatory Visit | Attending: Obstetrics | Admitting: Obstetrics

## 2012-12-24 ENCOUNTER — Encounter (HOSPITAL_COMMUNITY): Payer: Self-pay

## 2012-12-24 DIAGNOSIS — O358XX0 Maternal care for other (suspected) fetal abnormality and damage, not applicable or unspecified: Secondary | ICD-10-CM | POA: Insufficient documentation

## 2012-12-24 DIAGNOSIS — Z363 Encounter for antenatal screening for malformations: Secondary | ICD-10-CM | POA: Insufficient documentation

## 2012-12-24 DIAGNOSIS — O09529 Supervision of elderly multigravida, unspecified trimester: Secondary | ICD-10-CM | POA: Insufficient documentation

## 2012-12-24 DIAGNOSIS — O10019 Pre-existing essential hypertension complicating pregnancy, unspecified trimester: Secondary | ICD-10-CM | POA: Insufficient documentation

## 2012-12-24 DIAGNOSIS — Z1389 Encounter for screening for other disorder: Secondary | ICD-10-CM | POA: Insufficient documentation

## 2013-01-03 ENCOUNTER — Ambulatory Visit (HOSPITAL_COMMUNITY)
Admission: RE | Admit: 2013-01-03 | Discharge: 2013-01-03 | Disposition: A | Discharge status: Home / Self Care | Payer: Self-pay | Source: Ambulatory Visit | Attending: Obstetrics | Admitting: Obstetrics

## 2013-01-03 DIAGNOSIS — O10019 Pre-existing essential hypertension complicating pregnancy, unspecified trimester: Secondary | ICD-10-CM | POA: Insufficient documentation

## 2013-01-03 DIAGNOSIS — O09529 Supervision of elderly multigravida, unspecified trimester: Secondary | ICD-10-CM | POA: Insufficient documentation

## 2013-01-04 ENCOUNTER — Other Ambulatory Visit (HOSPITAL_COMMUNITY): Payer: Self-pay | Admitting: Obstetrics

## 2013-01-04 DIAGNOSIS — O09529 Supervision of elderly multigravida, unspecified trimester: Secondary | ICD-10-CM

## 2013-01-04 DIAGNOSIS — O10919 Unspecified pre-existing hypertension complicating pregnancy, unspecified trimester: Secondary | ICD-10-CM

## 2013-01-06 ENCOUNTER — Ambulatory Visit (HOSPITAL_COMMUNITY)
Admission: RE | Admit: 2013-01-06 | Discharge: 2013-01-06 | Disposition: A | Discharge status: Home / Self Care | Payer: Self-pay | Source: Ambulatory Visit | Attending: Obstetrics | Admitting: Obstetrics

## 2013-01-06 DIAGNOSIS — O09529 Supervision of elderly multigravida, unspecified trimester: Secondary | ICD-10-CM | POA: Insufficient documentation

## 2013-01-06 DIAGNOSIS — O10019 Pre-existing essential hypertension complicating pregnancy, unspecified trimester: Secondary | ICD-10-CM | POA: Insufficient documentation

## 2013-01-06 DIAGNOSIS — O10919 Unspecified pre-existing hypertension complicating pregnancy, unspecified trimester: Secondary | ICD-10-CM

## 2013-01-10 ENCOUNTER — Ambulatory Visit (HOSPITAL_COMMUNITY)
Admission: RE | Admit: 2013-01-10 | Discharge: 2013-01-10 | Disposition: A | Discharge status: Home / Self Care | Payer: Self-pay | Source: Ambulatory Visit | Attending: Obstetrics | Admitting: Obstetrics

## 2013-01-10 DIAGNOSIS — O10019 Pre-existing essential hypertension complicating pregnancy, unspecified trimester: Secondary | ICD-10-CM | POA: Insufficient documentation

## 2013-01-10 DIAGNOSIS — O09529 Supervision of elderly multigravida, unspecified trimester: Secondary | ICD-10-CM | POA: Insufficient documentation

## 2013-01-10 NOTE — Progress Notes (Deleted)
Ms. Galvis returned today for an NST and to review her BSs. Through an interpreter, she reported checking her BSs at the appropriate times. They remained elevated. She was started on glyburide 2.5 mg in AM and at HS. Hypoglycemic symptoms and treatment were reviewed. It was also stressed that she eat 3 meals and 3 snacks on a regular basis. She has a return appt on Thursday for NST and AFI. We will review her BSs then.

## 2013-01-11 ENCOUNTER — Other Ambulatory Visit (HOSPITAL_COMMUNITY): Payer: Self-pay | Admitting: Obstetrics

## 2013-01-11 DIAGNOSIS — O10019 Pre-existing essential hypertension complicating pregnancy, unspecified trimester: Secondary | ICD-10-CM

## 2013-01-11 DIAGNOSIS — O09529 Supervision of elderly multigravida, unspecified trimester: Secondary | ICD-10-CM

## 2013-01-13 ENCOUNTER — Ambulatory Visit (HOSPITAL_COMMUNITY)
Admission: RE | Admit: 2013-01-13 | Discharge: 2013-01-13 | Disposition: A | Discharge status: Home / Self Care | Payer: Self-pay | Source: Ambulatory Visit | Attending: Obstetrics | Admitting: Obstetrics

## 2013-01-13 ENCOUNTER — Other Ambulatory Visit (HOSPITAL_COMMUNITY): Payer: Self-pay | Admitting: Obstetrics

## 2013-01-13 ENCOUNTER — Encounter (HOSPITAL_COMMUNITY): Payer: Self-pay

## 2013-01-13 DIAGNOSIS — O09529 Supervision of elderly multigravida, unspecified trimester: Secondary | ICD-10-CM

## 2013-01-13 DIAGNOSIS — O10019 Pre-existing essential hypertension complicating pregnancy, unspecified trimester: Secondary | ICD-10-CM | POA: Insufficient documentation

## 2013-01-17 ENCOUNTER — Ambulatory Visit (HOSPITAL_COMMUNITY)
Admission: RE | Admit: 2013-01-17 | Discharge: 2013-01-17 | Disposition: A | Discharge status: Home / Self Care | Payer: Self-pay | Source: Ambulatory Visit | Attending: Obstetrics | Admitting: Obstetrics

## 2013-01-17 VITALS — BP 131/81 | Wt 203.0 lb

## 2013-01-17 DIAGNOSIS — Z3689 Encounter for other specified antenatal screening: Secondary | ICD-10-CM | POA: Insufficient documentation

## 2013-01-17 DIAGNOSIS — O09529 Supervision of elderly multigravida, unspecified trimester: Secondary | ICD-10-CM | POA: Insufficient documentation

## 2013-01-17 DIAGNOSIS — O10019 Pre-existing essential hypertension complicating pregnancy, unspecified trimester: Secondary | ICD-10-CM | POA: Insufficient documentation

## 2013-01-17 NOTE — Progress Notes (Signed)
Kristen Madden  was seen today for an ultrasound appointment.  See full report in [facility].  Impression: Single IUP at 33 3/7 weeks Follow up BPP due to chronic hypertension on Labetolol Active fetus with BPP of 8/8 Subjectively decreased amniotic fluie (AFI 5 cm, but has 2x2 cm pocket)  Recommendations: Continue 2x weekly testing (NSTs with at least weekly AFIs) Follow up growth scan and BPP next week.  Alpha Gula, MD

## 2013-01-18 ENCOUNTER — Other Ambulatory Visit (HOSPITAL_COMMUNITY): Payer: Self-pay | Admitting: Maternal and Fetal Medicine

## 2013-01-18 DIAGNOSIS — O10019 Pre-existing essential hypertension complicating pregnancy, unspecified trimester: Secondary | ICD-10-CM

## 2013-01-18 NOTE — Addendum Note (Signed)
Encounter addended by: Alessandra Bevels. Chase Picket, RN on: 01/18/2013  8:38 AM<BR>     Documentation filed: Charges VN, OB Prenatal Vitals, Episodes

## 2013-01-20 ENCOUNTER — Ambulatory Visit (HOSPITAL_COMMUNITY)
Admission: RE | Admit: 2013-01-20 | Discharge: 2013-01-20 | Disposition: A | Discharge status: Home / Self Care | Payer: Self-pay | Source: Ambulatory Visit | Attending: Obstetrics | Admitting: Obstetrics

## 2013-01-20 ENCOUNTER — Encounter (HOSPITAL_COMMUNITY): Payer: Self-pay

## 2013-01-20 VITALS — BP 126/70 | HR 66 | Wt 202.5 lb

## 2013-01-20 DIAGNOSIS — O09529 Supervision of elderly multigravida, unspecified trimester: Secondary | ICD-10-CM | POA: Insufficient documentation

## 2013-01-20 DIAGNOSIS — O10019 Pre-existing essential hypertension complicating pregnancy, unspecified trimester: Secondary | ICD-10-CM

## 2013-01-20 DIAGNOSIS — O10013 Pre-existing essential hypertension complicating pregnancy, third trimester: Secondary | ICD-10-CM

## 2013-01-20 NOTE — Progress Notes (Signed)
Kristen Madden  was seen today for an ultrasound appointment.  See full report in [facility].  Impression: Single IUP at 33 6/7 weeks Chronic hypertension on Labetolol Limited ultrasound for AFI only.  Amniotic fluid is subjectively decreasede (AFI 6.5 cm) Normal modified BPP.  Recommendations: Continue 2x weekly testing (NSTs with at least weekly AFIs) Follow up growth scan and BPP next week.  Alpha Gula, MD

## 2013-01-24 ENCOUNTER — Ambulatory Visit (HOSPITAL_COMMUNITY)
Admission: RE | Admit: 2013-01-24 | Discharge: 2013-01-24 | Disposition: A | Discharge status: Home / Self Care | Payer: Self-pay | Source: Ambulatory Visit | Attending: Obstetrics | Admitting: Obstetrics

## 2013-01-24 ENCOUNTER — Encounter (HOSPITAL_COMMUNITY): Payer: Self-pay

## 2013-01-24 DIAGNOSIS — O10019 Pre-existing essential hypertension complicating pregnancy, unspecified trimester: Secondary | ICD-10-CM | POA: Insufficient documentation

## 2013-01-24 DIAGNOSIS — O09529 Supervision of elderly multigravida, unspecified trimester: Secondary | ICD-10-CM | POA: Insufficient documentation

## 2013-01-27 ENCOUNTER — Ambulatory Visit (HOSPITAL_COMMUNITY): Payer: Self-pay

## 2013-01-27 ENCOUNTER — Encounter (HOSPITAL_COMMUNITY): Payer: Self-pay

## 2013-01-27 ENCOUNTER — Ambulatory Visit (HOSPITAL_COMMUNITY)
Admission: RE | Admit: 2013-01-27 | Discharge: 2013-01-27 | Disposition: A | Discharge status: Home / Self Care | Payer: Self-pay | Source: Ambulatory Visit | Attending: Obstetrics | Admitting: Obstetrics

## 2013-01-27 DIAGNOSIS — O09529 Supervision of elderly multigravida, unspecified trimester: Secondary | ICD-10-CM | POA: Insufficient documentation

## 2013-01-27 DIAGNOSIS — O4100X0 Oligohydramnios, unspecified trimester, not applicable or unspecified: Secondary | ICD-10-CM | POA: Insufficient documentation

## 2013-01-27 DIAGNOSIS — O10019 Pre-existing essential hypertension complicating pregnancy, unspecified trimester: Secondary | ICD-10-CM | POA: Insufficient documentation

## 2013-01-27 NOTE — Progress Notes (Signed)
Kristen Madden  was seen today for an ultrasound appointment.  See full report in [facility].  Impression: Single IUP at 34 6/7 weeks Follow up due to chronic hypertension on Labetolol Interval growth is appropriate (79th %tile) Active fetus with BPP of 8/8 Subjectively decreased amniotic fluie (AFI 8 cm)  Recommendations: Continue 2x weekly antepartum fetal testing  Alpha Gula, MD

## 2013-01-31 ENCOUNTER — Ambulatory Visit (HOSPITAL_COMMUNITY)
Admission: RE | Admit: 2013-01-31 | Discharge: 2013-01-31 | Disposition: A | Discharge status: Home / Self Care | Payer: Self-pay | Source: Ambulatory Visit | Attending: Obstetrics | Admitting: Obstetrics

## 2013-01-31 DIAGNOSIS — O4100X0 Oligohydramnios, unspecified trimester, not applicable or unspecified: Secondary | ICD-10-CM | POA: Insufficient documentation

## 2013-01-31 DIAGNOSIS — O09529 Supervision of elderly multigravida, unspecified trimester: Secondary | ICD-10-CM | POA: Insufficient documentation

## 2013-01-31 DIAGNOSIS — O10019 Pre-existing essential hypertension complicating pregnancy, unspecified trimester: Secondary | ICD-10-CM | POA: Insufficient documentation

## 2013-02-01 ENCOUNTER — Other Ambulatory Visit (HOSPITAL_COMMUNITY): Payer: Self-pay | Admitting: Obstetrics

## 2013-02-01 DIAGNOSIS — O09529 Supervision of elderly multigravida, unspecified trimester: Secondary | ICD-10-CM

## 2013-02-03 ENCOUNTER — Ambulatory Visit (HOSPITAL_COMMUNITY)
Admission: RE | Admit: 2013-02-03 | Discharge: 2013-02-03 | Disposition: A | Discharge status: Home / Self Care | Payer: Self-pay | Source: Ambulatory Visit | Attending: Obstetrics | Admitting: Obstetrics

## 2013-02-03 ENCOUNTER — Ambulatory Visit (HOSPITAL_COMMUNITY): Payer: Self-pay

## 2013-02-03 ENCOUNTER — Other Ambulatory Visit (HOSPITAL_COMMUNITY): Payer: Self-pay | Admitting: Obstetrics

## 2013-02-03 DIAGNOSIS — O10019 Pre-existing essential hypertension complicating pregnancy, unspecified trimester: Secondary | ICD-10-CM | POA: Insufficient documentation

## 2013-02-03 DIAGNOSIS — O09529 Supervision of elderly multigravida, unspecified trimester: Secondary | ICD-10-CM

## 2013-02-03 DIAGNOSIS — O4100X0 Oligohydramnios, unspecified trimester, not applicable or unspecified: Secondary | ICD-10-CM | POA: Insufficient documentation

## 2013-02-03 NOTE — Progress Notes (Signed)
Kristen Madden  was seen today for an ultrasound appointment.  See full report in [facility].  Impression: Single IUP at 35 6/7 weeks Chronic hypertension on Labetalol BPP 8/8 Subjectively decreased amniotic fluid (AFI 8.4 cm)  Recommendations: Continue 2x weekly NSTs with at least weekly AFIs. Recommend delivery at [redacted] weeks gestation is testing otherwise remains reassuring.  Alpha Gula, MD

## 2013-02-07 ENCOUNTER — Ambulatory Visit (HOSPITAL_COMMUNITY)
Admission: RE | Admit: 2013-02-07 | Discharge: 2013-02-07 | Disposition: A | Discharge status: Home / Self Care | Payer: Self-pay | Source: Ambulatory Visit | Attending: Obstetrics | Admitting: Obstetrics

## 2013-02-07 ENCOUNTER — Other Ambulatory Visit (HOSPITAL_COMMUNITY): Payer: Self-pay | Admitting: Obstetrics

## 2013-02-07 ENCOUNTER — Encounter (HOSPITAL_COMMUNITY): Payer: Self-pay

## 2013-02-07 DIAGNOSIS — O09529 Supervision of elderly multigravida, unspecified trimester: Secondary | ICD-10-CM | POA: Insufficient documentation

## 2013-02-07 DIAGNOSIS — O24913 Unspecified diabetes mellitus in pregnancy, third trimester: Secondary | ICD-10-CM

## 2013-02-07 DIAGNOSIS — O10019 Pre-existing essential hypertension complicating pregnancy, unspecified trimester: Secondary | ICD-10-CM | POA: Insufficient documentation

## 2013-02-07 DIAGNOSIS — O09523 Supervision of elderly multigravida, third trimester: Secondary | ICD-10-CM

## 2013-02-07 DIAGNOSIS — O288 Other abnormal findings on antenatal screening of mother: Secondary | ICD-10-CM

## 2013-02-07 DIAGNOSIS — O4100X0 Oligohydramnios, unspecified trimester, not applicable or unspecified: Secondary | ICD-10-CM | POA: Insufficient documentation

## 2013-02-07 DIAGNOSIS — O289 Unspecified abnormal findings on antenatal screening of mother: Secondary | ICD-10-CM | POA: Insufficient documentation

## 2013-02-07 NOTE — Progress Notes (Signed)
Kristen Madden  was seen today for an ultrasound appointment.  See full report in [facility].  Impression: Single IUP at 36 3/7 weeks Chronic hypertension on Labetalol BPP 8/10 (-2 for NR NST) Normal amniotic fluid volume (10 cm)  Recommendations: Continue 2x weekly NSTs with at least weekly AFIs. Recommend delivery at [redacted] weeks gestation is testing otherwise remains reassuring.  Alpha Gula, MD

## 2013-02-10 ENCOUNTER — Ambulatory Visit (HOSPITAL_COMMUNITY): Payer: Self-pay

## 2013-02-10 ENCOUNTER — Encounter (HOSPITAL_COMMUNITY): Payer: Self-pay

## 2013-02-10 ENCOUNTER — Ambulatory Visit (HOSPITAL_COMMUNITY)
Admission: RE | Admit: 2013-02-10 | Discharge: 2013-02-10 | Disposition: A | Discharge status: Home / Self Care | Payer: Self-pay | Source: Ambulatory Visit | Attending: Obstetrics | Admitting: Obstetrics

## 2013-02-10 ENCOUNTER — Other Ambulatory Visit (HOSPITAL_COMMUNITY): Payer: Self-pay | Admitting: Obstetrics

## 2013-02-10 DIAGNOSIS — O24913 Unspecified diabetes mellitus in pregnancy, third trimester: Secondary | ICD-10-CM

## 2013-02-10 DIAGNOSIS — E119 Type 2 diabetes mellitus without complications: Secondary | ICD-10-CM

## 2013-02-10 DIAGNOSIS — O09523 Supervision of elderly multigravida, third trimester: Secondary | ICD-10-CM

## 2013-02-10 DIAGNOSIS — O09529 Supervision of elderly multigravida, unspecified trimester: Secondary | ICD-10-CM | POA: Insufficient documentation

## 2013-02-10 DIAGNOSIS — O10019 Pre-existing essential hypertension complicating pregnancy, unspecified trimester: Secondary | ICD-10-CM | POA: Insufficient documentation

## 2013-02-10 DIAGNOSIS — O4100X0 Oligohydramnios, unspecified trimester, not applicable or unspecified: Secondary | ICD-10-CM | POA: Insufficient documentation

## 2013-02-10 NOTE — Progress Notes (Signed)
Kristen Madden  was seen today for an ultrasound appointment.  See full report in [facility].  Impression: Single IUP at 36 6/7 weeks Chronic hypertension on Labetalol BPP 8/8 Normal amniotic fluid volume (9 cm)  Recommendations: Continue 2x weekly NSTs with at least weekly AFIs. Recommend delivery at [redacted] weeks gestation is testing otherwise remains reassuring.  Alpha Gula, MD

## 2013-02-14 ENCOUNTER — Ambulatory Visit (HOSPITAL_COMMUNITY)
Admission: RE | Admit: 2013-02-14 | Discharge: 2013-02-14 | Disposition: A | Discharge status: Home / Self Care | Payer: Self-pay | Source: Ambulatory Visit | Attending: Obstetrics | Admitting: Obstetrics

## 2013-02-14 ENCOUNTER — Encounter (HOSPITAL_COMMUNITY): Payer: Self-pay

## 2013-02-14 DIAGNOSIS — O10019 Pre-existing essential hypertension complicating pregnancy, unspecified trimester: Secondary | ICD-10-CM | POA: Insufficient documentation

## 2013-02-14 DIAGNOSIS — O09529 Supervision of elderly multigravida, unspecified trimester: Secondary | ICD-10-CM | POA: Insufficient documentation

## 2013-02-17 ENCOUNTER — Encounter (HOSPITAL_COMMUNITY): Payer: Self-pay

## 2013-02-17 ENCOUNTER — Ambulatory Visit (HOSPITAL_COMMUNITY)
Admission: RE | Admit: 2013-02-17 | Discharge: 2013-02-17 | Disposition: A | Discharge status: Home / Self Care | Payer: Self-pay | Source: Ambulatory Visit | Attending: Obstetrics | Admitting: Obstetrics

## 2013-02-17 DIAGNOSIS — O24913 Unspecified diabetes mellitus in pregnancy, third trimester: Secondary | ICD-10-CM

## 2013-02-17 DIAGNOSIS — O09529 Supervision of elderly multigravida, unspecified trimester: Secondary | ICD-10-CM | POA: Insufficient documentation

## 2013-02-17 DIAGNOSIS — O09523 Supervision of elderly multigravida, third trimester: Secondary | ICD-10-CM

## 2013-02-17 DIAGNOSIS — O10019 Pre-existing essential hypertension complicating pregnancy, unspecified trimester: Secondary | ICD-10-CM | POA: Insufficient documentation

## 2013-02-21 ENCOUNTER — Ambulatory Visit (HOSPITAL_COMMUNITY)
Admission: RE | Admit: 2013-02-21 | Discharge: 2013-02-21 | Disposition: A | Discharge status: Home / Self Care | Payer: Self-pay | Source: Ambulatory Visit | Attending: Obstetrics | Admitting: Obstetrics

## 2013-02-21 DIAGNOSIS — O10019 Pre-existing essential hypertension complicating pregnancy, unspecified trimester: Secondary | ICD-10-CM | POA: Insufficient documentation

## 2013-02-21 DIAGNOSIS — O09529 Supervision of elderly multigravida, unspecified trimester: Secondary | ICD-10-CM | POA: Insufficient documentation

## 2013-02-23 ENCOUNTER — Inpatient Hospital Stay (HOSPITAL_COMMUNITY)
Admission: AD | Admit: 2013-02-23 | Discharge: 2013-02-25 | DRG: 775 | Disposition: A | Discharge status: Home / Self Care | Payer: Medicaid Other | Source: Ambulatory Visit | Attending: Obstetrics | Admitting: Obstetrics

## 2013-02-23 ENCOUNTER — Encounter (HOSPITAL_COMMUNITY): Payer: Self-pay | Admitting: *Deleted

## 2013-02-23 DIAGNOSIS — O139 Gestational [pregnancy-induced] hypertension without significant proteinuria, unspecified trimester: Principal | ICD-10-CM | POA: Diagnosis present

## 2013-02-23 DIAGNOSIS — O09529 Supervision of elderly multigravida, unspecified trimester: Secondary | ICD-10-CM | POA: Diagnosis present

## 2013-02-23 HISTORY — DX: Essential (primary) hypertension: I10

## 2013-02-23 HISTORY — DX: Unspecified ovarian cyst, unspecified side: N83.209

## 2013-02-23 LAB — CBC
Hemoglobin: 13.1 g/dL (ref 12.0–15.0)
MCH: 31.9 pg (ref 26.0–34.0)
MCHC: 35.4 g/dL (ref 30.0–36.0)
Platelets: 174 10*3/uL (ref 150–400)
RDW: 14.2 % (ref 11.5–15.5)

## 2013-02-23 LAB — RPR: RPR Ser Ql: NONREACTIVE

## 2013-02-23 LAB — TYPE AND SCREEN
ABO/RH(D): O POS
Antibody Screen: NEGATIVE

## 2013-02-23 MED ORDER — LANOLIN HYDROUS EX OINT: TOPICAL_OINTMENT | CUTANEOUS | Status: DC | PRN

## 2013-02-23 MED ORDER — TETANUS-DIPHTH-ACELL PERTUSSIS 5-2.5-18.5 LF-MCG/0.5 IM SUSP: 0.5000 mL | Freq: Once | INTRAMUSCULAR | Status: DC

## 2013-02-23 MED ORDER — CITRIC ACID-SODIUM CITRATE 334-500 MG/5ML PO SOLN: 30.0000 mL | ORAL | Status: DC | PRN

## 2013-02-23 MED ORDER — PRENATAL MULTIVITAMIN CH
1.0000 | ORAL_TABLET | Freq: Every day | ORAL | Status: DC
  Administered 2013-02-24 – 2013-02-25 (×2): 1 via ORAL
  Filled 2013-02-23 (×2): qty 1

## 2013-02-23 MED ORDER — OXYTOCIN 40 UNITS IN LACTATED RINGERS INFUSION - SIMPLE MED
62.5000 mL/h | INTRAVENOUS | Status: DC
  Filled 2013-02-23: qty 1000

## 2013-02-23 MED ORDER — IBUPROFEN 600 MG PO TABS
600.0000 mg | ORAL_TABLET | Freq: Four times a day (QID) | ORAL | Status: DC | PRN
  Administered 2013-02-23: 600 mg via ORAL
  Filled 2013-02-23 (×4): qty 1

## 2013-02-23 MED ORDER — DIPHENHYDRAMINE HCL 25 MG PO CAPS: 25.0000 mg | ORAL_CAPSULE | Freq: Four times a day (QID) | ORAL | Status: DC | PRN

## 2013-02-23 MED ORDER — SIMETHICONE 80 MG PO CHEW: 80.0000 mg | CHEWABLE_TABLET | ORAL | Status: DC | PRN

## 2013-02-23 MED ORDER — BUTORPHANOL TARTRATE 1 MG/ML IJ SOLN: 1.0000 mg | INTRAMUSCULAR | Status: DC | PRN

## 2013-02-23 MED ORDER — METHYLERGONOVINE MALEATE 0.2 MG/ML IJ SOLN
INTRAMUSCULAR | Status: AC
  Filled 2013-02-23: qty 1

## 2013-02-23 MED ORDER — OXYTOCIN BOLUS FROM INFUSION
500.0000 mL | INTRAVENOUS | Status: DC
  Administered 2013-02-23: 500 mL via INTRAVENOUS

## 2013-02-23 MED ORDER — LIDOCAINE HCL (PF) 1 % IJ SOLN
30.0000 mL | INTRAMUSCULAR | Status: DC | PRN
  Administered 2013-02-23: 30 mL via SUBCUTANEOUS
  Filled 2013-02-23 (×2): qty 30

## 2013-02-23 MED ORDER — FERROUS SULFATE 325 (65 FE) MG PO TABS
325.0000 mg | ORAL_TABLET | Freq: Two times a day (BID) | ORAL | Status: DC
  Administered 2013-02-24 – 2013-02-25 (×3): 325 mg via ORAL
  Filled 2013-02-23 (×3): qty 1

## 2013-02-23 MED ORDER — IBUPROFEN 600 MG PO TABS
600.0000 mg | ORAL_TABLET | Freq: Four times a day (QID) | ORAL | Status: DC
  Administered 2013-02-23 – 2013-02-25 (×7): 600 mg via ORAL
  Filled 2013-02-23 (×5): qty 1

## 2013-02-23 MED ORDER — ONDANSETRON HCL 4 MG/2ML IJ SOLN: 4.0000 mg | INTRAMUSCULAR | Status: DC | PRN

## 2013-02-23 MED ORDER — ONDANSETRON HCL 4 MG PO TABS: 4.0000 mg | ORAL_TABLET | ORAL | Status: DC | PRN

## 2013-02-23 MED ORDER — OXYCODONE-ACETAMINOPHEN 5-325 MG PO TABS: 1.0000 | ORAL_TABLET | ORAL | Status: DC | PRN

## 2013-02-23 MED ORDER — LACTATED RINGERS IV SOLN
INTRAVENOUS | Status: DC
  Administered 2013-02-23: 13:00:00 via INTRAVENOUS

## 2013-02-23 MED ORDER — LACTATED RINGERS IV SOLN: 500.0000 mL | INTRAVENOUS | Status: DC | PRN

## 2013-02-23 MED ORDER — FLEET ENEMA 7-19 GM/118ML RE ENEM: 1.0000 | ENEMA | RECTAL | Status: DC | PRN

## 2013-02-23 MED ORDER — ZOLPIDEM TARTRATE 5 MG PO TABS: 5.0000 mg | ORAL_TABLET | Freq: Every evening | ORAL | Status: DC | PRN

## 2013-02-23 MED ORDER — ACETAMINOPHEN 325 MG PO TABS: 650.0000 mg | ORAL_TABLET | ORAL | Status: DC | PRN

## 2013-02-23 MED ORDER — BENZOCAINE-MENTHOL 20-0.5 % EX AERO
1.0000 "application " | INHALATION_SPRAY | CUTANEOUS | Status: DC | PRN
  Administered 2013-02-23: 1 via TOPICAL
  Filled 2013-02-23: qty 56

## 2013-02-23 MED ORDER — LABETALOL HCL 100 MG PO TABS: 100.0000 mg | ORAL_TABLET | Freq: Two times a day (BID) | ORAL | Status: DC

## 2013-02-23 MED ORDER — WITCH HAZEL-GLYCERIN EX PADS
1.0000 "application " | MEDICATED_PAD | CUTANEOUS | Status: DC | PRN
  Administered 2013-02-23: 1 via TOPICAL

## 2013-02-23 MED ORDER — LABETALOL HCL 100 MG PO TABS
50.0000 mg | ORAL_TABLET | Freq: Two times a day (BID) | ORAL | Status: DC
  Administered 2013-02-23 – 2013-02-25 (×4): 50 mg via ORAL
  Filled 2013-02-23 (×6): qty 0.5

## 2013-02-23 MED ORDER — DIBUCAINE 1 % RE OINT: 1.0000 "application " | TOPICAL_OINTMENT | RECTAL | Status: DC | PRN

## 2013-02-23 MED ORDER — SENNOSIDES-DOCUSATE SODIUM 8.6-50 MG PO TABS
2.0000 | ORAL_TABLET | Freq: Every day | ORAL | Status: DC
  Administered 2013-02-23: 2 via ORAL

## 2013-02-23 MED ORDER — ONDANSETRON HCL 4 MG/2ML IJ SOLN: 4.0000 mg | Freq: Four times a day (QID) | INTRAMUSCULAR | Status: DC | PRN

## 2013-02-23 NOTE — H&P (Signed)
This is Dr. Francoise Ceo dictating the history and physical on  Kristen Madden she's a 37 year old gravida 2 para 1001 at 38 weeks and 5 days due date 829 negative GBS is been followed by maternal fetal medicine for hypertension mild she's been on date of 100 mg by mouth twice a day blood pressures have been under good control she's been getting them weekly nonstress tests and ultrasounds every 3 weeks his admitted in labor 5 cm had rapid progress of a normal vaginal delivery of a female Apgar 9 and 9 placenta was spontaneous she had a second-degree perineal which was repaired with 2-0 Vicryl physical exam well-developed female in no distress Past medical history negative Past surgical history negative Social history negative System review noncontributory Physical exam well-developed female postpartum no distress HEENT negative Lungs clear to P&A Heart regular rhythm no murmurs no gallops Abdomen 20 week size uterus postpartum Pelvic as described above Extremities negative

## 2013-02-23 NOTE — MAU Note (Signed)
Had not taken BP med yet today, normally takes it at 1230.Marland Kitchen Took it now.

## 2013-02-23 NOTE — MAU Note (Signed)
Started contracting last night, now every 5 min. Getting stronger.  Small amt of bloody mucous, small amt of water first noted at 1000. Pt is on BP medication, hx of problem prior to preg.

## 2013-02-24 ENCOUNTER — Ambulatory Visit (HOSPITAL_COMMUNITY): Payer: Self-pay

## 2013-02-24 LAB — CBC
HCT: 33 % — ABNORMAL LOW (ref 36.0–46.0)
MCHC: 34.8 g/dL (ref 30.0–36.0)
MCV: 90.2 fL (ref 78.0–100.0)
RDW: 14.3 % (ref 11.5–15.5)

## 2013-02-24 NOTE — Progress Notes (Signed)
UR completed 

## 2013-02-24 NOTE — Progress Notes (Signed)
Patient ID: Kristen Madden, female   DOB: February 21, 1976, 37 y.o.   MRN: [id number] Postpartum day one Vital signs normal Fundus firm Lochia moderate Legs negative doing well and

## 2013-02-25 MED ORDER — LABETALOL HCL 100 MG PO TABS: 50.0000 mg | ORAL_TABLET | Freq: Two times a day (BID) | ORAL | Status: DC

## 2013-02-25 NOTE — Discharge Summary (Signed)
Obstetric Discharge Summary Reason for Admission: onset of labor Prenatal Procedures: none Intrapartum Procedures: spontaneous vaginal delivery Postpartum Procedures: none Complications-Operative and Postpartum: none Hemoglobin  Date Value Range Status  02/24/2013 11.5* 12.0 - 15.0 g/dL Final  1/61/0960 45.4  12.2 - 16.2 g/dL Final     HCT  Date Value Range Status  02/24/2013 33.0* 36.0 - 46.0 % Final     HCT, POC  Date Value Range Status  09/24/2011 40.0  37.7 - 47.9 % Final    Physical Exam:  General: alert Lochia: appropriate Uterine Fundus: firm Incision: healing well DVT Evaluation: No evidence of DVT seen on physical exam.  Discharge Diagnoses: Term Pregnancy-delivered  Discharge Information: Date: 02/25/2013 Activity: pelvic rest Diet: routine Medications: Percocet Condition: stable Instructions: refer to practice specific booklet Discharge to: home Follow-up Information   Follow up with Kathreen Cosier, MD.   Specialty:  Obstetrics and Gynecology   Contact information:   [address] [place] [address] [phone]       Newborn Data: Live born female  Birth Weight: 7 lb 14.1 oz (3575 g) APGAR: 9, 9  Home with mother.  Loris Seelye A 02/25/2013, 6:34 AM

## 2013-02-25 NOTE — Progress Notes (Signed)
Review with parents the risks of offering formula to a breastfeeding newborn (interpreter & flip chart). Verbalized understanding.

## 2013-02-25 NOTE — Plan of Care (Signed)
Problem: Discharge Progression Outcomes Goal: Barriers To Progression Addressed/Resolved Outcome: Completed/Met Date Met:  02/25/13 Ltd English- using interpreter for teaching / instructions

## 2013-02-28 ENCOUNTER — Inpatient Hospital Stay (HOSPITAL_COMMUNITY): Admission: RE | Admit: 2013-02-28 | Payer: Self-pay | Source: Ambulatory Visit

## 2013-06-28 ENCOUNTER — Ambulatory Visit: Payer: Self-pay | Admitting: Family Medicine

## 2013-06-28 VITALS — BP 120/90 | HR 67 | Temp 98.6°F | Resp 16 | Ht 64.0 in | Wt 198.0 lb

## 2013-06-28 DIAGNOSIS — I10 Essential (primary) hypertension: Secondary | ICD-10-CM

## 2013-06-28 MED ORDER — LABETALOL HCL 100 MG PO TABS: 100.0000 mg | ORAL_TABLET | Freq: Two times a day (BID) | ORAL | Status: DC

## 2013-06-28 NOTE — Progress Notes (Signed)
[facility] [address], [place] [address] [phone]  Date:  06/28/2013   Name:  Kristen Madden   DOB:  [birth year]   MRN:  [id number]  PCP:  Tally Due, MD    Chief Complaint: Follow-up   History of Present Illness:  Kristen Madden is a 37 y.o. very pleasant female patient who presents with the following:  She is here today to follow-up her HTN.  She delivered 4 months ago- a healthy baby girl!  She is nursing and doing well. She needs a RF of her BP medication.  She was on HCTZ prior to pregnancy and changed to labetolol here when her pregnancy was confirmed.  She is not sure if she should change back or stay on the labetolol for now.  She has not noted any adverse SE and feels well with the medication.    Otherwise she is generally helathy  There are no active problems to display for this patient.   Past Medical History  Diagnosis Date  . Hypertension   . Ovarian cyst     Past Surgical History  Procedure Laterality Date  . No past surgeries      History  Substance Use Topics  . Smoking status: Never Smoker   . Smokeless tobacco: Never Used  . Alcohol Use: No    Family History  Problem Relation Age of Onset  . Hypertension Mother     No Known Allergies  Medication list has been reviewed and updated.  Current Outpatient Prescriptions on File Prior to Visit  Medication Sig Dispense Refill  . labetalol (NORMODYNE) 100 MG tablet Take 100 mg by mouth every 6 (six) hours.      . Prenatal Vit-Fe Fumarate-FA (PRENATAL MULTIVITAMIN) TABS tablet Take 1 tablet by mouth daily at 12 noon.       No current facility-administered medications on file prior to visit.    Review of Systems:  As per HPI- otherwise negative.   Physical Examination: Filed Vitals:   06/28/13 1107  BP: 120/90  Pulse: 67  Temp: 98.6 F (37 C)  Resp: 16   Filed Vitals:   06/28/13 1107  Height: 5\' 4"  (1.626 m)  Weight: 198 lb  (89.812 kg)   Body mass index is 33.97 kg/(m^2). Ideal Body Weight: Weight in (lb) to have BMI = 25: 145.3  GEN: WDWN, NAD, Non-toxic, A & O x 3, obese, looks well HEENT: Atraumatic, Normocephalic. Neck supple. No masses, No LAD. Ears and Nose: No external deformity. CV: RRR, No M/G/R. No JVD. No thrill. No extra heart sounds. PULM: CTA B, no wheezes, crackles, rhonchi. No retractions. No resp. distress. No accessory muscle use. ABD: S, NT, ND. No rebound. No HSM. EXTR: No c/c/e NEURO Normal gait.  PSYCH: Normally interactive. Conversant. Not depressed or anxious appearing.  Calm demeanor.    Assessment and Plan: HTN (hypertension) - Plan: labetalol (NORMODYNE) 100 MG tablet  She is currently doing well on labetolol.  Both HCTZ and labetolol are rated as "probably safe," although the product information available on Uptodate indicates that labetolol may be preferable for lactation.  Will keep her on labetolol until she weans her infant- then consider change back to HCTZ.  She is pleased with this plan and will come and see [place] when her daughter is weaned  Signed Abbe Amsterdam, MD

## 2013-11-24 IMAGING — US US OB LIMITED
1 series · 13 of 14 positions shown · non-contrast
Comparison: none

[Series 1: us ob limited · 0.26mm/px · 13 of 14 slices shown]
[im 1/14]
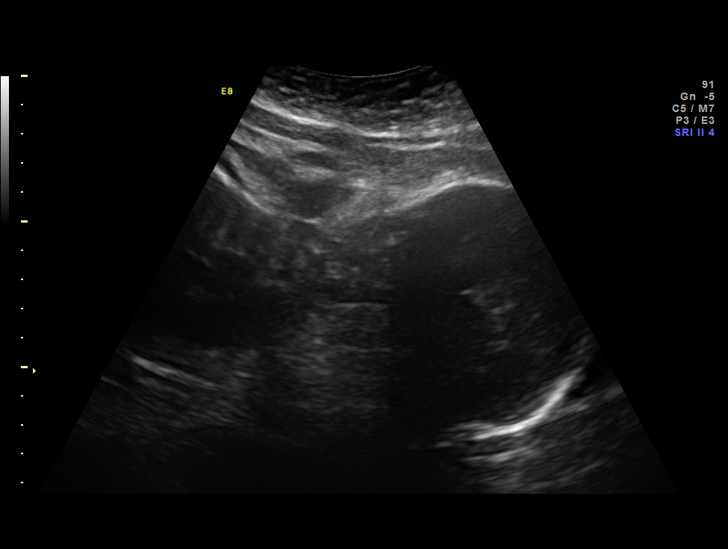
[im 2/14]
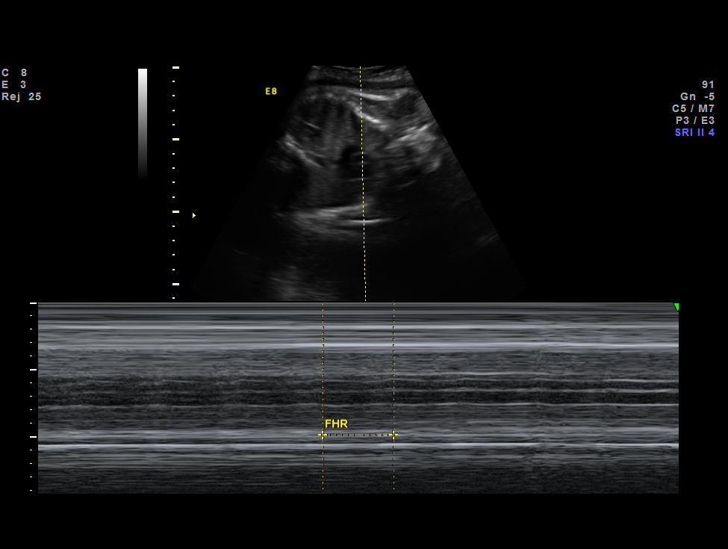
[im 3/14]
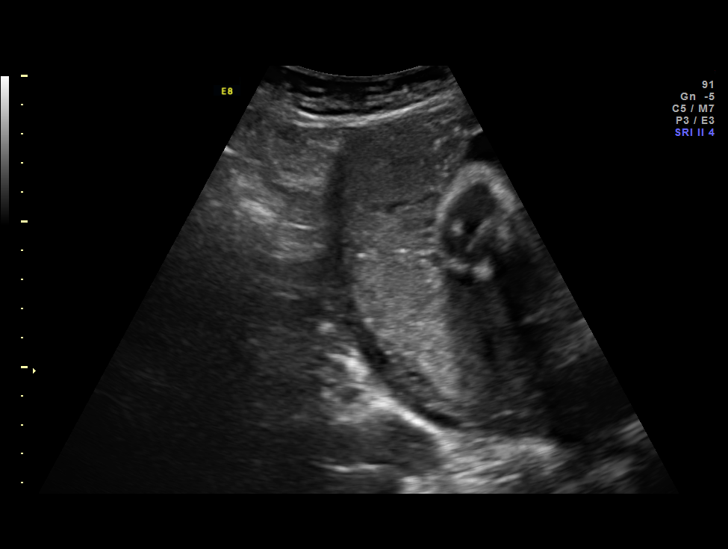
[im 4/14]
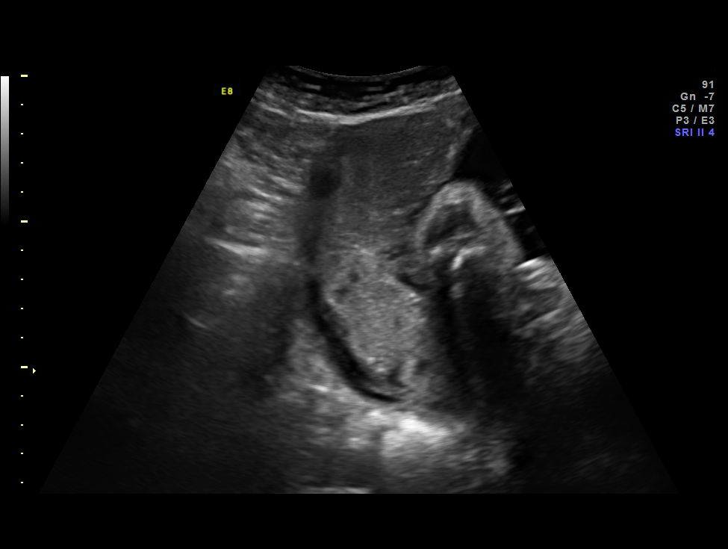
[im 5/14]
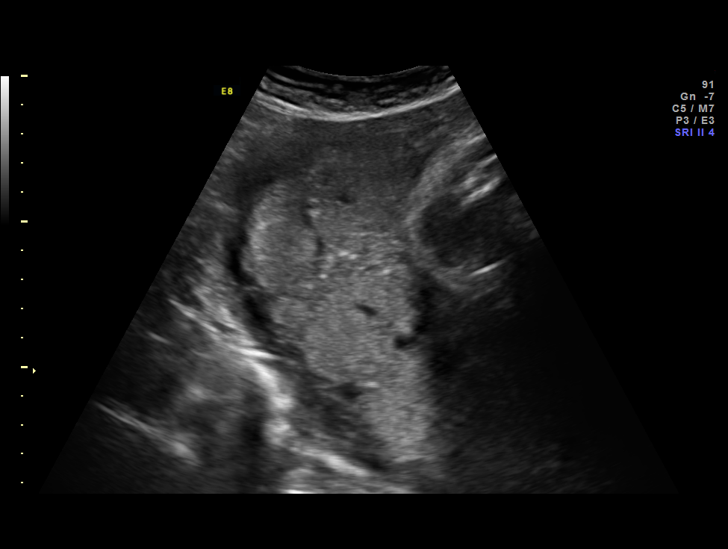
[im 6/14]
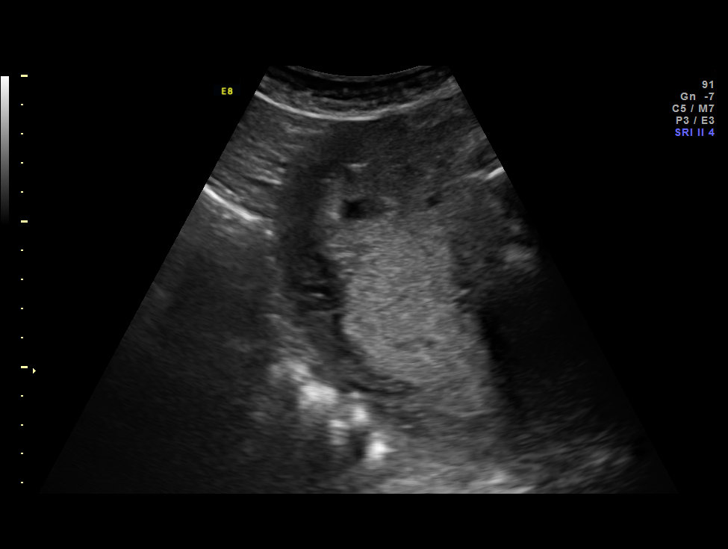
[im 8/14]
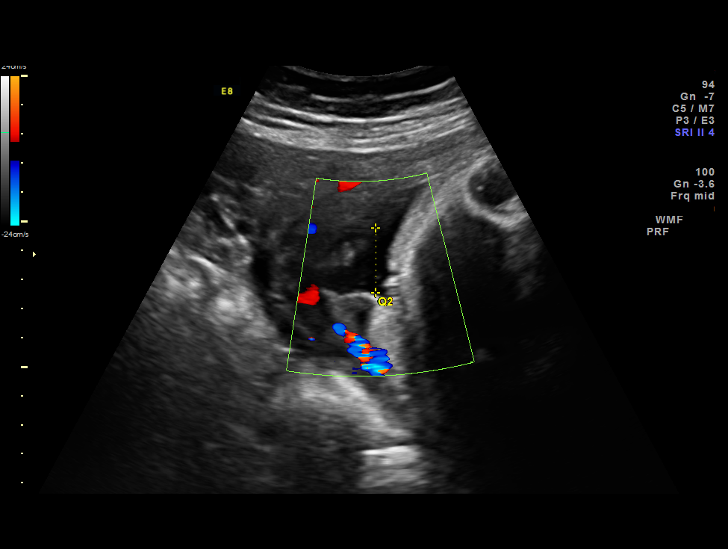
[im 9/14]
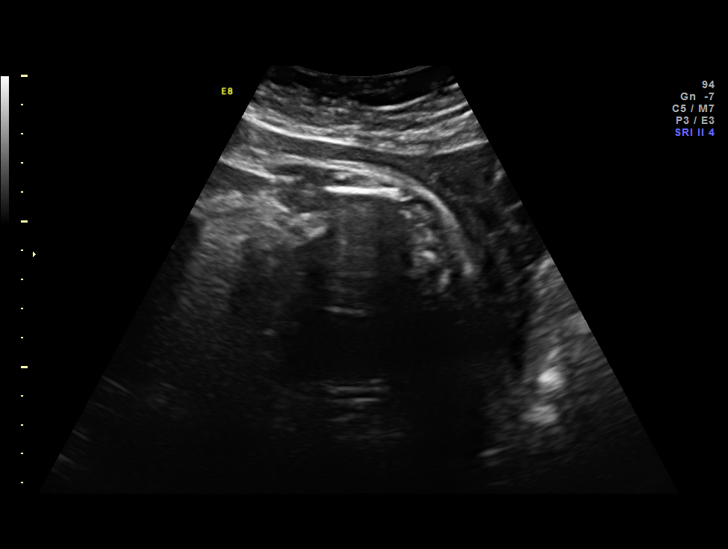
[im 10/14]
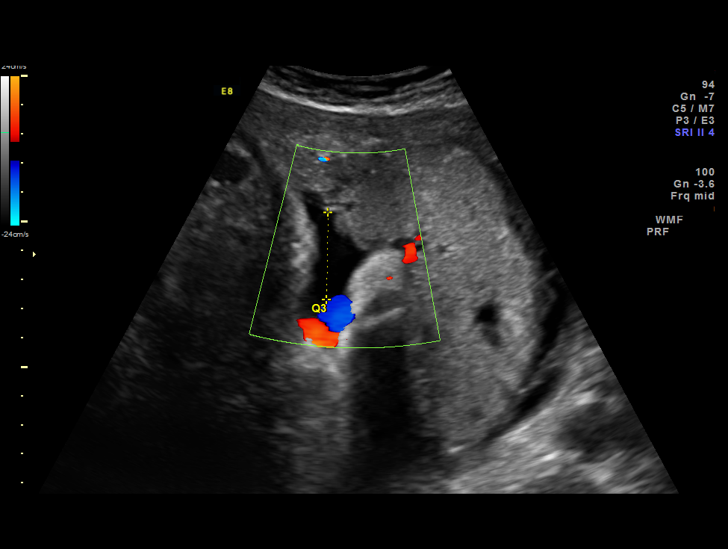
[im 11/14]
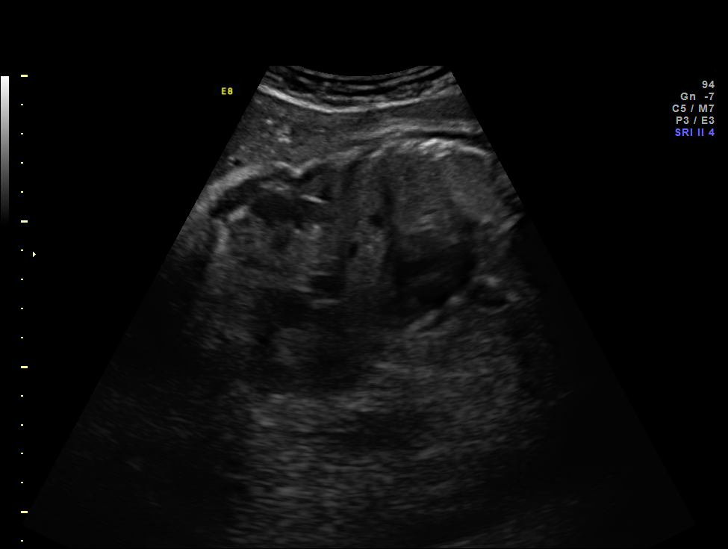
[im 12/14]
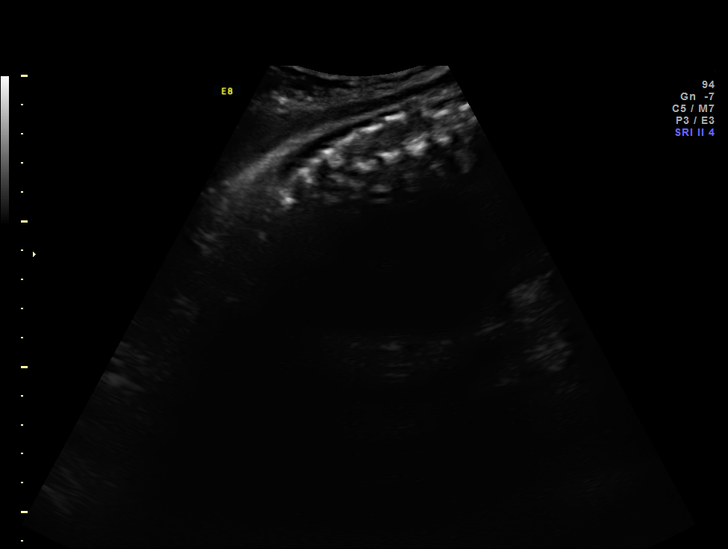
[im 13/14]
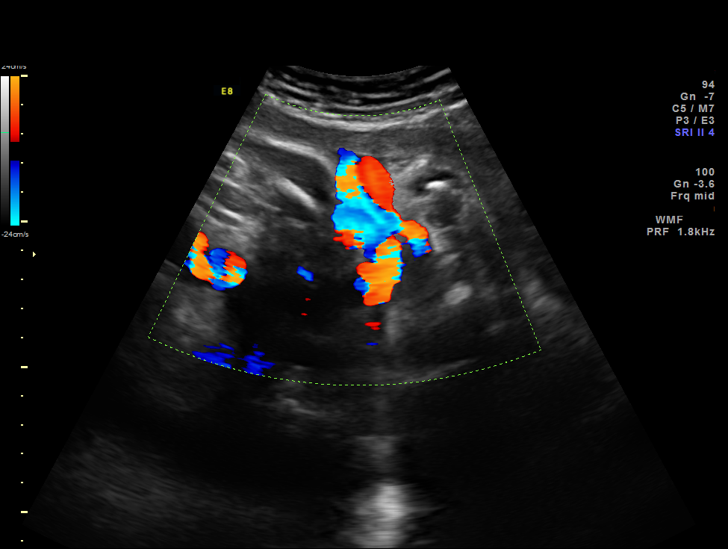
[im 14/14]
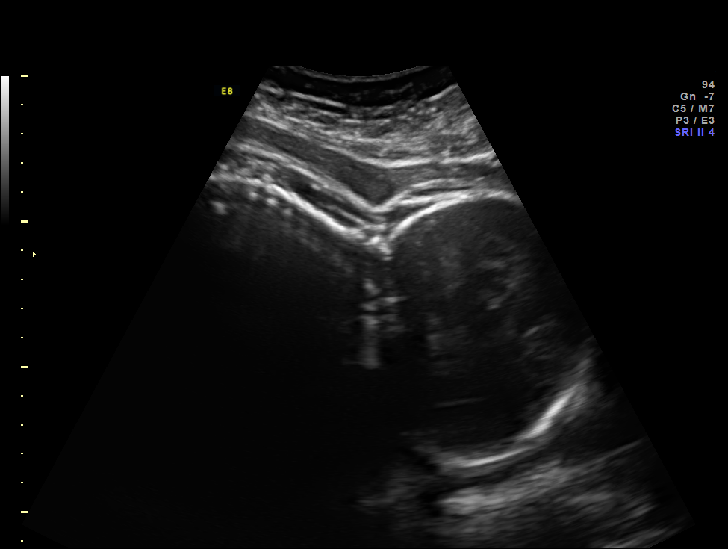

[13 of 14 positions shown; findings below may reference images not displayed]

OBSTETRICS REPORT
                      (Signed Final 01/20/2013 [DATE])

             PRETTY

Service(s) Provided

 [HOSPITAL]                                         76815.0
Indications

 Hypertension - Chronic/Pre-existing (on Labetalol)
 Advanced maternal age (AMA), Multigravida (36);
 declined screening
Fetal Evaluation

 Num Of Fetuses:    1
 Fetal Heart Rate:  142                          bpm
 Cardiac Activity:  Observed
 Presentation:      Cephalic
 Placenta:          Posterior, above cervical
                    os
 P. Cord            Previously Visualized
 Insertion:

 Amniotic Fluid
 AFI FV:      Subjectively decreased
 AFI Sum:     6.49    cm      < 3  %Tile     Larg Pckt:       3  cm
 RUQ:   1.26    cm   LUQ:    2.23   cm    LLQ:   3       cm
Gestational Age

 LMP:           33w 6d        Date:  05/28/12                 EDD:   03/04/13
 Best:          33w 6d     Det. By:  LMP  (05/28/12)          EDD:   03/04/13
Cervix Uterus Adnexa

 Cervix:       Not visualized (advanced GA >56wks)
Impression

 Single IUP at 33 [DATE] weeks
 Chronic hypertension on Labetolol
 Limited ultrasound for AFI only.  Amniotic fluid is subjectively
 decreasede (AFI 6.5 cm)
 Normal modified BPP.
Recommendations

 Continue 2x weekly testing (NSTs with at least weekly AFIs)
 Follow up growth scan and BPP next week.

## 2014-05-14 ENCOUNTER — Inpatient Hospital Stay (HOSPITAL_COMMUNITY)
Admission: EM | Admit: 2014-05-14 | Discharge: 2014-05-17 | DRG: 419 | Disposition: A | Discharge status: Home / Self Care | Payer: Medicaid Other | Attending: General Surgery | Admitting: General Surgery

## 2014-05-14 ENCOUNTER — Encounter (HOSPITAL_COMMUNITY): Payer: Self-pay | Admitting: *Deleted

## 2014-05-14 ENCOUNTER — Emergency Department (HOSPITAL_COMMUNITY): Payer: Medicaid Other

## 2014-05-14 ENCOUNTER — Inpatient Hospital Stay (HOSPITAL_COMMUNITY): Payer: Medicaid Other

## 2014-05-14 DIAGNOSIS — K851 Biliary acute pancreatitis without necrosis or infection: Secondary | ICD-10-CM | POA: Diagnosis present

## 2014-05-14 DIAGNOSIS — K859 Acute pancreatitis, unspecified: Secondary | ICD-10-CM | POA: Diagnosis present

## 2014-05-14 DIAGNOSIS — K8020 Calculus of gallbladder without cholecystitis without obstruction: Secondary | ICD-10-CM | POA: Diagnosis present

## 2014-05-14 DIAGNOSIS — I10 Essential (primary) hypertension: Secondary | ICD-10-CM | POA: Diagnosis present

## 2014-05-14 DIAGNOSIS — Z23 Encounter for immunization: Secondary | ICD-10-CM

## 2014-05-14 DIAGNOSIS — Z419 Encounter for procedure for purposes other than remedying health state, unspecified: Secondary | ICD-10-CM

## 2014-05-14 LAB — CBC WITH DIFFERENTIAL/PLATELET
BASOS ABS: 0 10*3/uL (ref 0.0–0.1)
Basophils Relative: 0 % (ref 0–1)
EOS PCT: 1 % (ref 0–5)
Eosinophils Absolute: 0.2 10*3/uL (ref 0.0–0.7)
HEMATOCRIT: 41.3 % (ref 36.0–46.0)
HEMOGLOBIN: 14 g/dL (ref 12.0–15.0)
LYMPHS ABS: 1.6 10*3/uL (ref 0.7–4.0)
LYMPHS PCT: 14 % (ref 12–46)
MCH: 29.7 pg (ref 26.0–34.0)
MCHC: 33.9 g/dL (ref 30.0–36.0)
MCV: 87.5 fL (ref 78.0–100.0)
MONO ABS: 1.2 10*3/uL — AB (ref 0.1–1.0)
MONOS PCT: 10 % (ref 3–12)
NEUTROS ABS: 8.3 10*3/uL — AB (ref 1.7–7.7)
Neutrophils Relative %: 73 % (ref 43–77)
Platelets: 282 10*3/uL (ref 150–400)
RBC: 4.72 MIL/uL (ref 3.87–5.11)
RDW: 13.3 % (ref 11.5–15.5)
WBC: 11.3 10*3/uL — AB (ref 4.0–10.5)

## 2014-05-14 LAB — COMPREHENSIVE METABOLIC PANEL
ALT: 601 U/L — ABNORMAL HIGH (ref 0–35)
AST: 839 U/L — AB (ref 0–37)
Albumin: 4.1 g/dL (ref 3.5–5.2)
Alkaline Phosphatase: 163 U/L — ABNORMAL HIGH (ref 39–117)
Anion gap: 14 (ref 5–15)
BILIRUBIN TOTAL: 2.4 mg/dL — AB (ref 0.3–1.2)
BUN: 7 mg/dL (ref 6–23)
CALCIUM: 9.1 mg/dL (ref 8.4–10.5)
CHLORIDE: 100 meq/L (ref 96–112)
CO2: 23 meq/L (ref 19–32)
CREATININE: 0.53 mg/dL (ref 0.50–1.10)
GLUCOSE: 113 mg/dL — AB (ref 70–99)
Potassium: 4 mEq/L (ref 3.7–5.3)
Sodium: 137 mEq/L (ref 137–147)
Total Protein: 7.4 g/dL (ref 6.0–8.3)

## 2014-05-14 LAB — SURGICAL PCR SCREEN
MRSA, PCR: NEGATIVE
STAPHYLOCOCCUS AUREUS: NEGATIVE

## 2014-05-14 LAB — LIPASE, BLOOD

## 2014-05-14 LAB — HCG, SERUM, QUALITATIVE: Preg, Serum: NEGATIVE

## 2014-05-14 MED ORDER — CETYLPYRIDINIUM CHLORIDE 0.05 % MT LIQD
7.0000 mL | Freq: Two times a day (BID) | OROMUCOSAL | Status: DC
  Administered 2014-05-14 – 2014-05-16 (×5): 7 mL via OROMUCOSAL

## 2014-05-14 MED ORDER — MORPHINE SULFATE 4 MG/ML IJ SOLN
4.0000 mg | Freq: Once | INTRAMUSCULAR | Status: AC
  Administered 2014-05-14: 4 mg via INTRAVENOUS
  Filled 2014-05-14: qty 1

## 2014-05-14 MED ORDER — ONDANSETRON HCL 4 MG/2ML IJ SOLN
4.0000 mg | Freq: Four times a day (QID) | INTRAMUSCULAR | Status: DC | PRN
  Administered 2014-05-16: 4 mg via INTRAVENOUS
  Filled 2014-05-14: qty 2

## 2014-05-14 MED ORDER — ALUM & MAG HYDROXIDE-SIMETH 200-200-20 MG/5ML PO SUSP
30.0000 mL | Freq: Once | ORAL | Status: AC
  Administered 2014-05-14: 30 mL via ORAL
  Filled 2014-05-14: qty 30

## 2014-05-14 MED ORDER — OXYCODONE HCL 5 MG PO TABS
5.0000 mg | ORAL_TABLET | ORAL | Status: DC | PRN
  Administered 2014-05-16: 5 mg via ORAL

## 2014-05-14 MED ORDER — SODIUM CHLORIDE 0.9 % IV SOLN
INTRAVENOUS | Status: DC
  Administered 2014-05-14 – 2014-05-17 (×6): via INTRAVENOUS

## 2014-05-14 MED ORDER — ONDANSETRON 4 MG PO TBDP
4.0000 mg | ORAL_TABLET | Freq: Once | ORAL | Status: AC
  Administered 2014-05-14: 4 mg via ORAL
  Filled 2014-05-14: qty 1

## 2014-05-14 MED ORDER — ACETAMINOPHEN 325 MG PO TABS
650.0000 mg | ORAL_TABLET | Freq: Four times a day (QID) | ORAL | Status: DC | PRN
  Administered 2014-05-15 – 2014-05-16 (×2): 650 mg via ORAL
  Filled 2014-05-14 (×2): qty 2

## 2014-05-14 MED ORDER — SODIUM CHLORIDE 0.9 % IV BOLUS (SEPSIS)
1000.0000 mL | Freq: Once | INTRAVENOUS | Status: AC
  Administered 2014-05-14: 1000 mL via INTRAVENOUS

## 2014-05-14 MED ORDER — ALBUTEROL SULFATE (2.5 MG/3ML) 0.083% IN NEBU: 2.5000 mg | INHALATION_SOLUTION | RESPIRATORY_TRACT | Status: DC | PRN

## 2014-05-14 MED ORDER — ONDANSETRON HCL 4 MG PO TABS: 4.0000 mg | ORAL_TABLET | Freq: Four times a day (QID) | ORAL | Status: DC | PRN

## 2014-05-14 MED ORDER — RANITIDINE HCL 150 MG/10ML PO SYRP
300.0000 mg | ORAL_SOLUTION | Freq: Once | ORAL | Status: AC
  Administered 2014-05-14: 300 mg via ORAL
  Filled 2014-05-14: qty 20

## 2014-05-14 MED ORDER — MORPHINE SULFATE 2 MG/ML IJ SOLN
1.0000 mg | INTRAMUSCULAR | Status: DC | PRN
  Administered 2014-05-14: 2 mg via INTRAVENOUS
  Filled 2014-05-14: qty 1

## 2014-05-14 MED ORDER — GADOBENATE DIMEGLUMINE 529 MG/ML IV SOLN
20.0000 mL | Freq: Once | INTRAVENOUS | Status: AC | PRN
  Administered 2014-05-14: 20 mL via INTRAVENOUS

## 2014-05-14 MED ORDER — LABETALOL HCL 100 MG PO TABS
100.0000 mg | ORAL_TABLET | Freq: Two times a day (BID) | ORAL | Status: DC
  Administered 2014-05-14 – 2014-05-17 (×6): 100 mg via ORAL
  Filled 2014-05-14 (×10): qty 1

## 2014-05-14 MED ORDER — HEPARIN SODIUM (PORCINE) 5000 UNIT/ML IJ SOLN
5000.0000 [IU] | Freq: Three times a day (TID) | INTRAMUSCULAR | Status: DC
  Administered 2014-05-14 – 2014-05-16 (×7): 5000 [IU] via SUBCUTANEOUS
  Filled 2014-05-14 (×10): qty 1

## 2014-05-14 MED ORDER — GUAIFENESIN-DM 100-10 MG/5ML PO SYRP: 5.0000 mL | ORAL_SOLUTION | ORAL | Status: DC | PRN

## 2014-05-14 MED ORDER — ACETAMINOPHEN 650 MG RE SUPP: 650.0000 mg | Freq: Four times a day (QID) | RECTAL | Status: DC | PRN

## 2014-05-14 NOTE — ED Provider Notes (Signed)
CSN: [id number]     Arrival date & time 05/14/14  0330 History   First MD Initiated Contact with Patient 05/14/14 0351     Chief Complaint  Patient presents with  . Abdominal Pain     (Consider location/radiation/quality/duration/timing/severity/associated sxs/prior Treatment) HPI   Kristen Madden is a 38 y.o. female with past medical history of hypertension and ovarian cyst coming in with abdominal pain. Patient states she has had this off and on for 3 years but got significantly worse over the last 2 days. Began yesterday at 1 PM, midepigastric abdominal pain with radiation to her back. She is unable to describe what the pain is like. Her pain got worse at 10 PM when she vomited. She vomited again at 3 AM. She denies any dysuria or hematuria. Has been no changes to her bowel movements. Patient denies ever having these symptoms in the past. She's had no fevers or recent infections. Took an Aleve without any relief. Patient has no further complaints.  10 Systems reviewed and are negative for acute change except as noted in the HPI.    Past Medical History  Diagnosis Date  . Hypertension   . Ovarian cyst    Past Surgical History  Procedure Laterality Date  . No past surgeries     Family History  Problem Relation Age of Onset  . Hypertension Mother    History  Substance Use Topics  . Smoking status: Never Smoker   . Smokeless tobacco: Never Used  . Alcohol Use: No   OB History    Gravida Para Term Preterm AB TAB SAB Ectopic Multiple Living   2 2 2  0 0 0 0 0 0 2     Review of Systems    Allergies  Review of patient's allergies indicates no known allergies.  Home Medications   Prior to Admission medications   Medication Sig Start Date End Date Taking? Authorizing Provider  labetalol (NORMODYNE) 100 MG tablet Take 1 tablet (100 mg total) by mouth 2 (two) times daily. 06/28/13  Yes Darreld Mclean, MD  Prenatal Vit-Fe Fumarate-FA (PRENATAL MULTIVITAMIN) TABS  tablet Take 1 tablet by mouth daily at 12 noon.    Historical Provider, MD   BP 122/73 mmHg  Pulse 67  Temp(Src) 98.2 F (36.8 C) (Oral)  Resp 21  SpO2 100%  LMP 05/14/2014 Physical Exam  Constitutional: She is oriented to person, place, and time. She appears well-developed and well-nourished. No distress.  HENT:  Head: Normocephalic and atraumatic.  Nose: Nose normal.  Mouth/Throat: Oropharynx is clear and moist. No oropharyngeal exudate.  Eyes: Conjunctivae and EOM are normal. Pupils are equal, round, and reactive to light. No scleral icterus.  Neck: Normal range of motion. Neck supple. No JVD present. No tracheal deviation present. No thyromegaly present.  Cardiovascular: Normal rate, regular rhythm and normal heart sounds.  Exam reveals no gallop and no friction rub.   No murmur heard. Pulmonary/Chest: Effort normal and breath sounds normal. No respiratory distress. She has no wheezes. She exhibits no tenderness.  Abdominal: Soft. Bowel sounds are normal. She exhibits no distension and no mass. There is tenderness. There is no rebound and no guarding.  Midepigastric and right upper quadrant tenderness to palpation. Positive Murphy's sign. No CVA tenderness.  Musculoskeletal: Normal range of motion. She exhibits no edema or tenderness.  Lymphadenopathy:    She has no cervical adenopathy.  Neurological: She is alert and oriented to person, place, and time. No cranial nerve deficit. She  exhibits normal muscle tone.  Skin: Skin is warm and dry. No rash noted. She is not diaphoretic. No erythema. No pallor.  Nursing note and vitals reviewed.   ED Course  Procedures (including critical care time) Labs Review Labs Reviewed  CBC WITH DIFFERENTIAL - Abnormal; Notable for the following:    WBC 11.3 (*)    Neutro Abs 8.3 (*)    Monocytes Absolute 1.2 (*)    All other components within normal limits  COMPREHENSIVE METABOLIC PANEL - Abnormal; Notable for the following:    Glucose, Bld  113 (*)    AST 839 (*)    ALT 601 (*)    Alkaline Phosphatase 163 (*)    Total Bilirubin 2.4 (*)    All other components within normal limits  LIPASE, BLOOD - Abnormal; Notable for the following:    Lipase >3000 (*)    All other components within normal limits  HCG, SERUM, QUALITATIVE    Imaging Review No results found.   EKG Interpretation None      MDM   Final diagnoses:  Gallstone pancreatitis    Patient since emergency department out of concern for abdominal pain. History of physical exam are concerning for biliary pathology versus pancreatitis.  She denies alcohol abuse.Laboratory studies reveal a transaminitis, elevated bilirubin, elevated lipase, consistent with gallstone pancreatitis. I perform bedside ultrasound revealed a large gallstone in the gallbladder with no wall thickening or pericholecystic fluid. Will obtain formal ultrasound and consult surgery. Patient will likely need to be admitted for ERCP versus surgical intervention. This was explained to the family via nurse translator and they were amenable to plan.  Surgery was consulted who rec for medicine admission with GI consult.  Dr. Arnoldo Morale was paged for admission, patient will go to med surg unit.  She requested that I consult GI, which was done, they will come and evaluate the patient.   EMERGENCY DEPARTMENT US GALLBLADDER INTERPRETATION "Study: Limited Ultrasound of the gallbladder and common bile duct."  INDICATIONS: RUQ pain Indication: Multiple views of the gallbladder and common bile duct are obtained with a  Multi-frequency probe."  PERFORMED BY:  Myself  IMAGES ARCHIVED?: No  FINDINGS: Gallstones present  LIMITATIONS: Body Habitus  INTERPRETATION: Cholelithiasis  COMMENT:  Large gallstone present, likely diagnosis is gallstone pancreatitis.     Everlene Balls, MD 05/14/14 1410

## 2014-05-14 NOTE — Consult Note (Signed)
Reason for Consult:abdominal pain Referring Physician: Dr. [name] is an 38 y.o. female.  HPI: The pt is a 38yo hispanic female who presents with abdominal pain. U/S shows stones in gallbladder. LFT's and lipase are very elevated. She feels better after arriving to floor  Past Medical History  Diagnosis Date  . Hypertension   . Ovarian cyst     Past Surgical History  Procedure Laterality Date  . No past surgeries      Family History  Problem Relation Age of Onset  . Hypertension Mother     Social History:  reports that she has never smoked. She has never used smokeless tobacco. She reports that she does not drink alcohol or use illicit drugs.  Allergies: No Known Allergies  Medications: I have reviewed the patient's current medications.  Results for orders placed or performed during the hospital encounter of 05/14/14 (from the past 48 hour(s))  CBC with Differential     Status: Abnormal   Collection Time: 05/14/14  3:54 AM  Result Value Ref Range   WBC 11.3 (H) 4.0 - 10.5 K/uL   RBC 4.72 3.87 - 5.11 MIL/uL   Hemoglobin 14.0 12.0 - 15.0 g/dL   HCT 41.3 36.0 - 46.0 %   MCV 87.5 78.0 - 100.0 fL   MCH 29.7 26.0 - 34.0 pg   MCHC 33.9 30.0 - 36.0 g/dL   RDW 13.3 11.5 - 15.5 %   Platelets 282 150 - 400 K/uL   Neutrophils Relative % 73 43 - 77 %   Neutro Abs 8.3 (H) 1.7 - 7.7 K/uL   Lymphocytes Relative 14 12 - 46 %   Lymphs Abs 1.6 0.7 - 4.0 K/uL   Monocytes Relative 10 3 - 12 %   Monocytes Absolute 1.2 (H) 0.1 - 1.0 K/uL   Eosinophils Relative 1 0 - 5 %   Eosinophils Absolute 0.2 0.0 - 0.7 K/uL   Basophils Relative 0 0 - 1 %   Basophils Absolute 0.0 0.0 - 0.1 K/uL  Comprehensive metabolic panel     Status: Abnormal   Collection Time: 05/14/14  3:54 AM  Result Value Ref Range   Sodium 137 137 - 147 mEq/L   Potassium 4.0 3.7 - 5.3 mEq/L   Chloride 100 96 - 112 mEq/L   CO2 23 19 - 32 mEq/L   Glucose, Bld 113 (H) 70 - 99 mg/dL   BUN 7 6 - 23  mg/dL   Creatinine, Ser 0.53 0.50 - 1.10 mg/dL   Calcium 9.1 8.4 - 10.5 mg/dL   Total Protein 7.4 6.0 - 8.3 g/dL   Albumin 4.1 3.5 - 5.2 g/dL   AST 839 (H) 0 - 37 U/L   ALT 601 (H) 0 - 35 U/L   Alkaline Phosphatase 163 (H) 39 - 117 U/L   Total Bilirubin 2.4 (H) 0.3 - 1.2 mg/dL   GFR calc non Af Amer >90 >90 mL/min   GFR calc Af Amer >90 >90 mL/min    Comment: (NOTE) The eGFR has been calculated using the CKD EPI equation. This calculation has not been validated in all clinical situations. eGFR's persistently <90 mL/min signify possible Chronic Kidney Disease.    Anion gap 14 5 - 15  Lipase, blood     Status: Abnormal   Collection Time: 05/14/14  3:54 AM  Result Value Ref Range   Lipase >3000 (H) 11 - 59 U/L    Comment: RESULT CONFIRMED BY AUTOMATED DILUTION  hCG, serum,  qualitative     Status: None   Collection Time: 05/14/14  3:54 AM  Result Value Ref Range   Preg, Serum NEGATIVE NEGATIVE    Comment:        THE SENSITIVITY OF THIS METHODOLOGY IS >10 mIU/mL.     US Abdomen Limited  05/14/2014   CLINICAL DATA:  38 year old female with 2 day history of nausea, vomiting and back pain  EXAM: US ABDOMEN LIMITED - RIGHT UPPER QUADRANT  COMPARISON:  None.  FINDINGS: Gallbladder:  Echogenic foci with posterior acoustic shadowing noted within the gallbladder lumen consistent with cholelithiasis. The largest individual stone measures 2.7 cm. No evidence of pericholecystic fluid or gallbladder wall thickening. Per the sonographer, sonographic Percell Miller sign was negative.  Common bile duct:  Diameter: Within normal limits at 4 mm  Liver:  No focal lesion identified. Within normal limits in parenchymal echogenicity. Main portal vein is patent with normal hepatopetal flow.  IMPRESSION: Cholelithiasis without secondary sonographic findings to suggest acute cholecystitis.   Electronically Signed   By: Jacqulynn Cadet M.D.   On: 05/14/2014 07:20    Review of Systems  Constitutional: Negative.    HENT: Negative.   Eyes: Negative.   Respiratory: Negative.   Gastrointestinal: Positive for abdominal pain.  Genitourinary: Negative.   Musculoskeletal: Negative.   Skin: Negative.   Neurological: Negative.   Endo/Heme/Allergies: Negative.   Psychiatric/Behavioral: Negative.    Blood pressure 144/77, pulse 63, temperature 97.8 F (36.6 C), temperature source Oral, resp. rate 16, weight 202 lb 9.6 oz (91.9 kg), last menstrual period 05/14/2014, SpO2 98 %, currently breastfeeding. Physical Exam  Constitutional: She is oriented to person, place, and time. She appears well-developed and well-nourished.  HENT:  Head: Normocephalic and atraumatic.  Eyes: Conjunctivae and EOM are normal. Pupils are equal, round, and reactive to light.  Neck: Normal range of motion. Neck supple.  Cardiovascular: Normal rate, regular rhythm and normal heart sounds.   Respiratory: Effort normal and breath sounds normal.  GI: Soft. Bowel sounds are normal.  Mild upper abdominal tenderness with no guarding  Musculoskeletal: Normal range of motion.  Neurological: She is alert and oriented to person, place, and time.  Skin: Skin is warm and dry.  Psychiatric: She has a normal mood and affect. Her behavior is normal.    Assessment/Plan: The pt appears to have gallstone pancreatitis. I would recommend bowel rest and serial exams and checking of lft's and lipase. Once her enzymes normalize then I think she would benefit from having her gallbladder removed. We will follow closely with you  TOTH III,PAUL S 05/14/2014, 10:08 AM

## 2014-05-14 NOTE — Consult Note (Signed)
Referring Provider: [facility] Primary Care Physician:  [name], MD Primary Gastroenterologist:  unassigned  Reason for Consultation: gallstone pancreatitis    HPI: Kristen Madden is a 38 y.o. female admitted through ER yesterday with complaints of epigastric pain and nausea. Pt had eaten a bowl of oatmeal for breakfast and around noon developed severe pain in the midback and epigastric area.She vomitied several times and her pain got worse. She reports that she had an episode of pain like this last Sunday several hours after dineer, and felt nauseous but did not vomit. The pain then dissipated but returned yesterday after breakfast. She says she has had this pain intermittently for 3 years, but it has been a little more frequent she she delivered her daughter 14 months ago. IN the ER she had an u/s that showed gallstones, and blood work revealed elevated LFTs and lipase.She has not has light colored stools or dark urine. She had no fever at home but says she has chills today. ( Pt speaks broken [place], but her husband is with her and provides some history with her).LMP now.Pt says her pain is less today than it was on admission.Med service has prdered MRCP which pt has been called for and will be heading downstairs for soon.   Past Medical History  Diagnosis Date  . Hypertension   . Ovarian cyst     Past Surgical History  Procedure Laterality Date  . No past surgeries      Prior to Admission medications   Medication Sig Start Date End Date Taking? Authorizing Provider  labetalol (NORMODYNE) 100 MG tablet Take 1 tablet (100 mg total) by mouth 2 (two) times daily. 06/28/13  Yes Darreld Mclean, MD  Prenatal Vit-Fe Fumarate-FA (PRENATAL MULTIVITAMIN) TABS tablet Take 1 tablet by mouth daily at 12 noon.    Historical Provider, MD    Current Facility-Administered Medications  Medication Dose Route Frequency Provider Last Rate Last Dose  . 0.9 %  sodium  chloride infusion   Intravenous Continuous Jonetta Osgood, MD 150 mL/hr at 05/14/14 0930    . acetaminophen (TYLENOL) tablet 650 mg  650 mg Oral Q6H PRN Shanker Kristeen Mans, MD       Or  . acetaminophen (TYLENOL) suppository 650 mg  650 mg Rectal Q6H PRN Shanker Kristeen Mans, MD      . albuterol (PROVENTIL) (2.5 MG/3ML) 0.083% nebulizer solution 2.5 mg  2.5 mg Nebulization Q2H PRN Shanker Kristeen Mans, MD      . guaiFENesin-dextromethorphan (ROBITUSSIN DM) 100-10 MG/5ML syrup 5 mL  5 mL Oral Q4H PRN Shanker Kristeen Mans, MD      . heparin injection 5,000 Units  5,000 Units Subcutaneous 3 times per day Jonetta Osgood, MD   5,000 Units at 05/14/14 [phone]  . labetalol (NORMODYNE) tablet 100 mg  100 mg Oral BID Jonetta Osgood, MD   100 mg at 05/14/14 3846  . morphine 2 MG/ML injection 1-2 mg  1-2 mg Intravenous Q4H PRN Shanker Kristeen Mans, MD      . ondansetron (ZOFRAN) tablet 4 mg  4 mg Oral Q6H PRN Shanker Kristeen Mans, MD       Or  . ondansetron (ZOFRAN) injection 4 mg  4 mg Intravenous Q6H PRN Shanker Kristeen Mans, MD      . oxyCODONE (Oxy IR/ROXICODONE) immediate release tablet 5 mg  5 mg Oral Q4H PRN Jonetta Osgood, MD        Allergies as of 05/14/2014  . (  No Known Allergies)    Family History  Problem Relation Age of Onset  . Hypertension Mother     History   Social History  . Marital Status: Married    Spouse Name: N/A    Number of Children: N/A  . Years of Education: N/A   Occupational History  . Not on file.   Social History Main Topics  . Smoking status: Never Smoker   . Smokeless tobacco: Never Used  . Alcohol Use: No  . Drug Use: No  . Sexual Activity: Not on file   Other Topics Concern  . Not on file   Social History Narrative    Review of Systems: Gen: Denies any fever,has chills this morning CV: Denies chest pain, angina, palpitations, syncope, orthopnea, PND, peripheral edema, and claudication. Resp: Denies dyspnea at rest, dyspnea with exercise, cough, sputum,  wheezing, coughing up blood, and pleurisy. GI: Admits to epigastric pain and vomiting. GU : Denies urinary burning, blood in urine, urinary frequency, urinary hesitancy, nocturnal urination, and urinary incontinence. MS: Denies joint pain, limitation of movement, and swelling, stiffness, low back pain, extremity pain. Denies muscle weakness, cramps, atrophy.  Derm: Denies rash, itching, dry skin, hives, moles, warts, or unhealing ulcers.  Psych: Denies depression, anxiety, memory loss, suicidal ideation, hallucinations, paranoia, and confusion. Heme: Denies bruising, bleeding, and enlarged lymph nodes. Neuro:  Denies any headaches, dizziness, paresthesias. Endo:  Denies any problems with DM, thyroid, adrenal function.  Physical Exam: Vital signs in last 24 hours: Temp:  [97.8 F (36.6 C)-98.2 F (36.8 C)] 97.8 F (36.6 C) (11/08 0823) Pulse Rate:  [62-72] 63 (11/08 0823) Resp:  [12-21] 16 (11/08 0823) BP: (113-144)/(58-77) 144/77 mmHg (11/08 0823) SpO2:  [98 %-100 %] 98 % (11/08 0823) Weight:  [202 lb 9.6 oz (91.9 kg)] 202 lb 9.6 oz (91.9 kg) (11/08 6387)   General:   Alert,  Well-developed, well-nourished, pleasant and cooperative in NAD Head:  Normocephalic and atraumatic. Eyes:  Sclera clear, no icterus. Conjunctiva pink. Ears:  Normal auditory acuity. Nose:  No deformity, discharge, or lesions. Mouth:  No deformity or lesions.   Neck:  Supple; no masses or thyromegaly. Lungs:  Clear throughout to auscultation.   No wheezes, crackles, or rhonchi.  Heart:  Regular rate and rhythm; no murmurs, clicks, rubs,  or gallops. Abdomen:  Soft,tender epiggastric area, RUQ and LUQ, no CVAT, BS active,nonpalp mass or hsm.   Rectal:  Deferred  Msk:  Symmetrical without gross deformities. . Pulses:  Normal pulses noted. Extremities:  Without clubbing or edema. Neurologic:  Alert and  oriented x4;  grossly normal neurologically. Skin:  Intact without significant lesions or rashes.. Psych:   Alert and cooperative. Normal mood and affect.  Lab Results:  Recent Labs  05/14/14 0354  WBC 11.3*  HGB 14.0  HCT 41.3  PLT 282   BMET  Recent Labs  05/14/14 0354  NA 137  K 4.0  CL 100  CO2 23  GLUCOSE 113*  BUN 7  CREATININE 0.53  CALCIUM 9.1   LFT  Recent Labs  05/14/14 0354  PROT 7.4  ALBUMIN 4.1  AST 839*  ALT 601*  ALKPHOS 163*  BILITOT 2.4*    Studies/Results: US Abdomen Limited  05/14/2014   CLINICAL DATA:  38 year old female with 2 day history of nausea, vomiting and back pain  EXAM: US ABDOMEN LIMITED - RIGHT UPPER QUADRANT  COMPARISON:  None.  FINDINGS: Gallbladder:  Echogenic foci with posterior acoustic shadowing noted within the gallbladder  lumen consistent with cholelithiasis. The largest individual stone measures 2.7 cm. No evidence of pericholecystic fluid or gallbladder wall thickening. Per the sonographer, sonographic Percell Miller sign was negative.  Common bile duct:  Diameter: Within normal limits at 4 mm  Liver:  No focal lesion identified. Within normal limits in parenchymal echogenicity. Main portal vein is patent with normal hepatopetal flow.  IMPRESSION: Cholelithiasis without secondary sonographic findings to suggest acute cholecystitis.   Electronically Signed   By: Jacqulynn Cadet M.D.   On: 05/14/2014 07:20    IMPRESSION/PLAN: 38 yo female admitted with gallstone pancreatitis. Pt says she has less pain today (? If stone passed). Continue NPO and IVF.  Will review with Dr Hilarie Fredrickson as she may need ERCP (pending results of MRCP) then likely laparoscopic cholecystectomy.   Neville Pauls, Deloris Ping 05/14/2014,  Pager [phone]

## 2014-05-14 NOTE — Progress Notes (Signed)
MRI/MRCP reviewed No evidence of CBD stone.  Acute pancreatitis and gallstones, without cholecystitis No plan for ERCP at this time Expect lap chole after clinical improvement from pancreatitis with IOC Continue supportive care, IVFs, pain control GI available for questions.

## 2014-05-14 NOTE — ED Notes (Signed)
Patient transported to Ultrasound 

## 2014-05-14 NOTE — ED Notes (Signed)
The pt is c/o generalized abd pain with some  Back pain for 2 days with n v  lmp now

## 2014-05-14 NOTE — ED Notes (Signed)
Attempted to give report to [place] unit.

## 2014-05-14 NOTE — H&P (Signed)
PATIENT DETAILS Name: Kristen Madden Age: 38 y.o. Sex: female Date of Birth: 05/03/1976 Admit Date: 05/14/2014 LKJ:[id number], Veneda Melter, MD   CHIEF COMPLAINT:  ABDOMINAL PAIN-2 DAYS  HPI: Kristen Madden is a 38 y.o. female with Past Medical History of HTN who presents today with the above noted complaint.Per patient,she's been having epigastric pain for the past 2 days. Pain is 8/10 at its worst, no radiation, associated with several episodes of vomiting. She presented to the ED today, where lab work was consistent with pancreatitis. Abdominal ultrasound shows gallstones. I was subsequently asked to admit this patient for further evaluation and treatment. No fever, headache, chest pain, shortness of breath. No diarrhea.   ALLERGIES:  No Known Allergies  PAST MEDICAL HISTORY: Past Medical History  Diagnosis Date  . Hypertension   . Ovarian cyst     PAST SURGICAL HISTORY: Past Surgical History  Procedure Laterality Date  . No past surgeries      MEDICATIONS AT HOME: Prior to Admission medications   Medication Sig Start Date End Date Taking? Authorizing Provider  labetalol (NORMODYNE) 100 MG tablet Take 1 tablet (100 mg total) by mouth 2 (two) times daily. 06/28/13  Yes Darreld Mclean, MD  Prenatal Vit-Fe Fumarate-FA (PRENATAL MULTIVITAMIN) TABS tablet Take 1 tablet by mouth daily at 12 noon.    Historical Provider, MD    FAMILY HISTORY: Family History  Problem Relation Age of Onset  . Hypertension Mother     SOCIAL HISTORY:  reports that she has never smoked. She has never used smokeless tobacco. She reports that she does not drink alcohol or use illicit drugs.  REVIEW OF SYSTEMS:  Constitutional:   No  weight loss, night sweats,  Fevers, chills, fatigue.  HEENT:    No headaches, Difficulty swallowing,Tooth/dental problems,Sore throat,   Cardio-vascular: No chest pain,  Orthopnea, PND, swelling in lower extremities, anasarca, dizziness,  palpitations  GI:  No heartburn, indigestion,  diarrhea, change in  bowel habits, loss of appetite  Resp: No shortness of breath with exertion or at rest.  No excess mucus, no productive cough, No non-productive cough,  No coughing up of blood.No change in color of mucus  Skin:  no rash or lesions.  GU:  no dysuria, change in color of urine, no urgency or frequency.  No flank pain.  Musculoskeletal: No joint pain or swelling.  No decreased range of motion.  No back pain.  Psych: No change in mood or affect. No depression or anxiety.  No memory loss.   PHYSICAL EXAM: Blood pressure 144/77, pulse 63, temperature 97.8 F (36.6 C), temperature source Oral, resp. rate 16, weight 91.9 kg (202 lb 9.6 oz), last menstrual period 05/14/2014, SpO2 98 %, currently breastfeeding.  General appearance :Awake, alert, not in any distress. Speech Clear. Not toxic Looking HEENT: Atraumatic and Normocephalic, pupils equally reactive to light and accomodation Neck: supple, no JVD. No cervical lymphadenopathy.  Chest:Good air entry bilaterally, no added sounds  CVS: S1 S2 regular, no murmurs.  Abdomen: Bowel sounds present, Minimally tender and not distended with no gaurding, rigidity or rebound. Extremities: B/L Lower Ext shows no edema, both legs are warm to touch Neurology: Awake alert, and oriented X 3, CN II-XII intact, Non focal Skin:No Rash Wounds:N/A  LABS ON ADMISSION:   Recent Labs  05/14/14 0354  NA 137  K 4.0  CL 100  CO2 23  GLUCOSE 113*  BUN 7  CREATININE 0.53  CALCIUM  9.1    Recent Labs  05/14/14 0354  AST 839*  ALT 601*  ALKPHOS 163*  BILITOT 2.4*  PROT 7.4  ALBUMIN 4.1    Recent Labs  05/14/14 0354  LIPASE >3000*    Recent Labs  05/14/14 0354  WBC 11.3*  NEUTROABS 8.3*  HGB 14.0  HCT 41.3  MCV 87.5  PLT 282   No results for input(s): CKTOTAL, CKMB, CKMBINDEX, TROPONINI in the last 72 hours. No results for input(s): DDIMER in the last 72  hours. Invalid input(s): POCBNP   RADIOLOGIC STUDIES ON ADMISSION: US Abdomen Limited  05/14/2014   CLINICAL DATA:  38 year old female with 2 day history of nausea, vomiting and back pain  EXAM: US ABDOMEN LIMITED - RIGHT UPPER QUADRANT  COMPARISON:  None.  FINDINGS: Gallbladder:  Echogenic foci with posterior acoustic shadowing noted within the gallbladder lumen consistent with cholelithiasis. The largest individual stone measures 2.7 cm. No evidence of pericholecystic fluid or gallbladder wall thickening. Per the sonographer, sonographic Percell Miller sign was negative.  Common bile duct:  Diameter: Within normal limits at 4 mm  Liver:  No focal lesion identified. Within normal limits in parenchymal echogenicity. Main portal vein is patent with normal hepatopetal flow.  IMPRESSION: Cholelithiasis without secondary sonographic findings to suggest acute cholecystitis.   Electronically Signed   By: Jacqulynn Cadet M.D.   On: 05/14/2014 07:20   ASSESSMENT AND PLAN: Present on Admission:  . Gallstone pancreatitis:Keep NPO, IVF, check MRCP to make sure no CBD stones. GI/Gen Surgery consult. Will require Lap Cholecystectomy once pancreatitis resolves. Looks stable, belly is soft. Other supportive care.  Marland Kitchen NMM:[id number] with Labetalol.  Further plan will depend as patient's clinical course evolves and further radiologic and laboratory data become available. Patient will be monitored closely.  Above noted plan was discussed with patient/spouse (via translator on the phone), they were in agreement.   DVT Prophylaxis: Prophylactic  Heparin  Code Status: Full Code  Disposition Plan: Home when pancreatitis resolves    Total time spent for admission equals 45 minutes.  [facility] Pager [phone]  If 7PM-7AM, please contact night-coverage [email or web address] Password [email or web address] 05/14/2014, 1:34 PM

## 2014-05-15 LAB — CBC
HCT: 36.3 % (ref 36.0–46.0)
Hemoglobin: 12.1 g/dL (ref 12.0–15.0)
MCH: 29.6 pg (ref 26.0–34.0)
MCHC: 33.3 g/dL (ref 30.0–36.0)
MCV: 88.8 fL (ref 78.0–100.0)
Platelets: 235 10*3/uL (ref 150–400)
RBC: 4.09 MIL/uL (ref 3.87–5.11)
RDW: 13.5 % (ref 11.5–15.5)
WBC: 8.7 10*3/uL (ref 4.0–10.5)

## 2014-05-15 LAB — BASIC METABOLIC PANEL
Anion gap: 14 (ref 5–15)
BUN: 7 mg/dL (ref 6–23)
CALCIUM: 8.3 mg/dL — AB (ref 8.4–10.5)
CO2: 21 mEq/L (ref 19–32)
CREATININE: 0.57 mg/dL (ref 0.50–1.10)
Chloride: 108 mEq/L (ref 96–112)
GFR calc Af Amer: >90 mL/min (ref >90)
GLUCOSE: 90 mg/dL (ref 70–99)
Potassium: 3.8 mEq/L (ref 3.7–5.3)
SODIUM: 143 meq/L (ref 137–147)

## 2014-05-15 LAB — HEPATIC FUNCTION PANEL
ALK PHOS: 222 U/L — AB (ref 39–117)
ALT: 484 U/L — ABNORMAL HIGH (ref 0–35)
AST: 304 U/L — ABNORMAL HIGH (ref 0–37)
Albumin: 3.4 g/dL — ABNORMAL LOW (ref 3.5–5.2)
BILIRUBIN DIRECT: 0.4 mg/dL — AB (ref 0.0–0.3)
BILIRUBIN INDIRECT: 1.5 mg/dL — AB (ref 0.3–0.9)
TOTAL PROTEIN: 6.4 g/dL (ref 6.0–8.3)
Total Bilirubin: 1.9 mg/dL — ABNORMAL HIGH (ref 0.3–1.2)

## 2014-05-15 LAB — LIPASE, BLOOD: Lipase: 1102 U/L — ABNORMAL HIGH (ref 11–59)

## 2014-05-15 MED ORDER — INFLUENZA VAC SPLIT QUAD 0.5 ML IM SUSY
0.5000 mL | PREFILLED_SYRINGE | INTRAMUSCULAR | Status: AC
  Administered 2014-05-16: 0.5 mL via INTRAMUSCULAR
  Filled 2014-05-15: qty 0.5

## 2014-05-15 NOTE — Progress Notes (Signed)
Subjective: Denies n/v. States abd pain much better.   Objective: Vital signs in last 24 hours: Temp:  [98 F (36.7 C)-98.5 F (36.9 C)] 98.4 F (36.9 C) (11/09 0443) Pulse Rate:  [61-66] 66 (11/09 0443) Resp:  [16-17] 17 (11/09 0443) BP: (117-124)/(62-72) 117/69 mmHg (11/09 0443) SpO2:  [98 %-100 %] 99 % (11/09 0443)    Intake/Output from previous day: 11/08 0701 - 11/09 0700 In: 2420 [I.V.:2420] Out: -  Intake/Output this shift: Total I/O In: 538 [P.O.:538] Out: -   Alert, nontoxic, nad, smiling Soft, nt, nd, no icterus  Lab Results:   Recent Labs  05/14/14 0354 05/15/14 0423  WBC 11.3* 8.7  HGB 14.0 12.1  HCT 41.3 36.3  PLT 282 235   BMET  Recent Labs  05/14/14 0354 05/15/14 0423  NA 137 143  K 4.0 3.8  CL 100 108  CO2 23 21  GLUCOSE 113* 90  BUN 7 7  CREATININE 0.53 0.57  CALCIUM 9.1 8.3*   Hepatic Function Latest Ref Rng 05/15/2014 05/14/2014  Total Protein 6.0 - 8.3 g/dL 6.4 7.4  Albumin 3.5 - 5.2 g/dL 3.4(L) 4.1  AST 0 - 37 U/L 304(H) 839(H)  ALT 0 - 35 U/L 484(H) 601(H)  Alk Phosphatase 39 - 117 U/L 222(H) 163(H)  Total Bilirubin 0.3 - 1.2 mg/dL 1.9(H) 2.4(H)  Bilirubin, Direct 0.0 - 0.3 mg/dL 0.4(H) -     PT/INR No results for input(s): LABPROT, INR in the last 72 hours. ABG No results for input(s): PHART, HCO3 in the last 72 hours.  Invalid input(s): PCO2, PO2  Studies/Results: Mr 3d Recon At Scanner  05/14/2014   CLINICAL DATA:  Abdominal pain, cholelithiasis, abnormal LFTs. Elevated lipase.  EXAM: MRI ABDOMEN WITHOUT AND WITH CONTRAST (INCLUDING MRCP)  TECHNIQUE: Multiplanar multisequence MR imaging of the abdomen was performed both before and after the administration of intravenous contrast. Heavily T2-weighted images of the biliary and pancreatic ducts were obtained, and three-dimensional MRCP images were rendered by post processing.  CONTRAST:  20mL MULTIHANCE GADOBENATE DIMEGLUMINE 529 MG/ML IV SOLN  COMPARISON:  Abdominal  ultrasound dated 05/14/2014.  FINDINGS: Liver is within normal limits. No suspicious/enhancing hepatic lesions. No hepatic steatosis.  Multiple layering gallstones, including a 2.6 cm gallstone in the gallbladder fundus. No gallbladder wall thickening or pericholecystic fluid.  No intrahepatic or extrahepatic ductal dilatation. Common duct measures 6 mm, within normal limits, and smoothly tapers at the ampulla. No choledocholithiasis is seen.  Peripancreatic inflammatory changes, reflecting acute pancreatitis. No evidence of pancreatic necrosis. No drainable fluid collection/ pseudocyst.  Spleen is within normal limits.  Kidneys are within normal limits.  No hydronephrosis.  Small volume abdominal ascites.  No suspicious abdominal lymphadenopathy.  No focal osseous lesions.  IMPRESSION: Acute pancreatitis, without evidence of complication.  Cholelithiasis, without evidence of acute cholecystitis.  Common duct measures 6 mm.  No choledocholithiasis is seen.   Electronically Signed   By: Sriyesh  Krishnan M.D.   On: 05/14/2014 17:12   Us Abdomen Limited  05/14/2014   CLINICAL DATA:  38-year-old female with 2 day history of nausea, vomiting and back pain  EXAM: US ABDOMEN LIMITED - RIGHT UPPER QUADRANT  COMPARISON:  None.  FINDINGS: Gallbladder:  Echogenic foci with posterior acoustic shadowing noted within the gallbladder lumen consistent with cholelithiasis. The largest individual stone measures 2.7 cm. No evidence of pericholecystic fluid or gallbladder wall thickening. Per the sonographer, sonographic Murphy sign was negative.  Common bile duct:  Diameter: Within normal limits at 4   mm  Liver:  No focal lesion identified. Within normal limits in parenchymal echogenicity. Main portal vein is patent with normal hepatopetal flow.  IMPRESSION: Cholelithiasis without secondary sonographic findings to suggest acute cholecystitis.   Electronically Signed   By: Heath  McCullough M.D.   On: 05/14/2014 07:20   Mr Abd  W/wo Cm/mrcp  05/14/2014   CLINICAL DATA:  Abdominal pain, cholelithiasis, abnormal LFTs. Elevated lipase.  EXAM: MRI ABDOMEN WITHOUT AND WITH CONTRAST (INCLUDING MRCP)  TECHNIQUE: Multiplanar multisequence MR imaging of the abdomen was performed both before and after the administration of intravenous contrast. Heavily T2-weighted images of the biliary and pancreatic ducts were obtained, and three-dimensional MRCP images were rendered by post processing.  CONTRAST:  20mL MULTIHANCE GADOBENATE DIMEGLUMINE 529 MG/ML IV SOLN  COMPARISON:  Abdominal ultrasound dated 05/14/2014.  FINDINGS: Liver is within normal limits. No suspicious/enhancing hepatic lesions. No hepatic steatosis.  Multiple layering gallstones, including a 2.6 cm gallstone in the gallbladder fundus. No gallbladder wall thickening or pericholecystic fluid.  No intrahepatic or extrahepatic ductal dilatation. Common duct measures 6 mm, within normal limits, and smoothly tapers at the ampulla. No choledocholithiasis is seen.  Peripancreatic inflammatory changes, reflecting acute pancreatitis. No evidence of pancreatic necrosis. No drainable fluid collection/ pseudocyst.  Spleen is within normal limits.  Kidneys are within normal limits.  No hydronephrosis.  Small volume abdominal ascites.  No suspicious abdominal lymphadenopathy.  No focal osseous lesions.  IMPRESSION: Acute pancreatitis, without evidence of complication.  Cholelithiasis, without evidence of acute cholecystitis.  Common duct measures 6 mm.  No choledocholithiasis is seen.   Electronically Signed   By: Sriyesh  Krishnan M.D.   On: 05/14/2014 17:12    Anti-infectives: Anti-infectives    None      Assessment/Plan: Gallstone pancreatitis  No fever, pain improved, LFTs trending down except for AP; MRI reviewed no evidence of CBD stone. Just Gallstones and pancreatitis  If cont to do well - plan on Lap Chole with IOC Tuesday Can have sips today but NPO p MN Repeat labs in  AM  Longino Trefz M. Aikam Hellickson, MD, FACS General, Bariatric, & Minimally Invasive Surgery [facility]   LOS: 1 day    Fina Heizer M 05/15/2014  

## 2014-05-15 NOTE — Progress Notes (Signed)
PROGRESS NOTE  Kristen Madden PHK:[id number] DOB: 06-20-1976 DOA: 05/14/2014 PCP: [name], MD  HPI/Subjective: Pain is much better today, No N/V.   Assessment/Plan: Gallstone Pancreatitis: Improved with supportive care, Lipase and LFT's down trending. Abd exam very soft. MRCP neg for choledocholithiasis. Per Surgery- Lap Chole w/ IOC tentative for 11/10. Pt can have sips but NPO p MN with repeat labs in am. IVF and analgesics being given.   Hypertension: Chronic condition well controled outpatient on Normodyne. Stable. Continue home meds.   DVT Prophylaxis: Heparin   Code Status:Full  Family Communication: Spoke with patient and partner at bedside. Disposition Plan: Home when medically appropriate.    Consultants:  General Surgery  GI  Procedures:  Lap Chole with IOC planned for 11/10  Antibiotics:  None  Objective: Filed Vitals:   05/14/14 0823 05/14/14 1358 05/14/14 2118 05/15/14 0443  BP: 144/77 122/62 124/72 117/69  Pulse: 63 64 61 66  Temp: 97.8 F (36.6 C) 98 F (36.7 C) 98.5 F (36.9 C) 98.4 F (36.9 C)  TempSrc: Oral Oral Oral Oral  Resp: 16 16 17 17   Weight: 91.9 kg (202 lb 9.6 oz)     SpO2: 98% 98% 100% 99%    Intake/Output Summary (Last 24 hours) at 05/15/14 1027 Last data filed at 05/15/14 0900  Gross per 24 hour  Intake   2958 ml  Output      0 ml  Net   2958 ml   Filed Weights   05/14/14 0823  Weight: 91.9 kg (202 lb 9.6 oz)    Exam: General: Pt is resting comfortably in chair, Well developed, well nourished, NAD, appears stated age  16:  EOMI, Anicteic Sclera, MMM. Neck: Supple, no JVD, no masses  Cardiovascular: RRR, S1 S2 auscultated, no rubs, murmurs or gallops.   Respiratory: Clear to auscultation bilaterally with equal chest rise  Abdomen: Soft, Moderate tenderness to palpation throughout, nondistended, + bowel sounds  Extremities: warm dry without cyanosis clubbing or edema.     Data  Reviewed: Basic Metabolic Panel:  Recent Labs Lab 05/14/14 0354 05/15/14 0423  NA 137 143  K 4.0 3.8  CL 100 108  CO2 23 21  GLUCOSE 113* 90  BUN 7 7  CREATININE 0.53 0.57  CALCIUM 9.1 8.3*   Liver Function Tests:  Recent Labs Lab 05/14/14 0354 05/15/14 0423  AST 839* 304*  ALT 601* 484*  ALKPHOS 163* 222*  BILITOT 2.4* 1.9*  PROT 7.4 6.4  ALBUMIN 4.1 3.4*    Recent Labs Lab 05/14/14 0354 05/15/14 0423  LIPASE >3000* 1102*   CBC:  Recent Labs Lab 05/14/14 0354 05/15/14 0423  WBC 11.3* 8.7  NEUTROABS 8.3*  --   HGB 14.0 12.1  HCT 41.3 36.3  MCV 87.5 88.8  PLT 282 235    Recent Results (from the past 240 hour(s))  Surgical pcr screen     Status: None   Collection Time: 05/14/14  4:59 PM  Result Value Ref Range Status   MRSA, PCR NEGATIVE NEGATIVE Final   Staphylococcus aureus NEGATIVE NEGATIVE Final    Comment:        The Xpert SA Assay (FDA approved for NASAL specimens in patients over 35 years of age), is one component of a comprehensive surveillance program.  Test performance has been validated by [employer] for patients greater than or equal to 15 year old. It is not intended to diagnose infection nor to guide or monitor treatment.  Studies: Mr 3d Recon At Scanner  05/15/2014   CLINICAL DATA:  Abdominal pain, cholelithiasis, abnormal LFTs. Elevated lipase.  EXAM: MRI ABDOMEN WITHOUT AND WITH CONTRAST (INCLUDING MRCP)  TECHNIQUE: Multiplanar multisequence MR imaging of the abdomen was performed both before and after the administration of intravenous contrast. Heavily T2-weighted images of the biliary and pancreatic ducts were obtained, and three-dimensional MRCP images were rendered by post processing.  CONTRAST:  82m MULTIHANCE GADOBENATE DIMEGLUMINE 529 MG/ML IV SOLN  COMPARISON:  Abdominal ultrasound dated 111/09/15  FINDINGS: Liver is within normal limits. No suspicious/enhancing hepatic lesions. No hepatic steatosis.  Multiple  layering gallstones, including a 2.6 cm gallstone in the gallbladder fundus. No gallbladder wall thickening or pericholecystic fluid.  No intrahepatic or extrahepatic ductal dilatation. Common duct measures 6 mm, within normal limits, and smoothly tapers at the ampulla. No choledocholithiasis is seen.  Peripancreatic inflammatory changes, reflecting acute pancreatitis. No evidence of pancreatic necrosis. No drainable fluid collection/ pseudocyst.  Spleen is within normal limits.  Kidneys are within normal limits.  No hydronephrosis.  Small volume abdominal ascites.  No suspicious abdominal lymphadenopathy.  No focal osseous lesions.  IMPRESSION: Acute pancreatitis, without evidence of complication.  Cholelithiasis, without evidence of acute cholecystitis.  Common duct measures 6 mm.  No choledocholithiasis is seen.   Electronically Signed   By: SJulian HyM.D.   On: 109-Nov-201517:12   [place]domen Limited  1November 09, 2015  CLINICAL DATA:  38year old female with 2 day history of nausea, vomiting and back pain  EXAM: [place]DOM[place] LIMITED - RIGHT UPPER QUADRANT  COMPARISON:  None.  FINDINGS: Gallbladder:  Echogenic foci with posterior acoustic shadowing noted within the gallbladder lumen consistent with cholelithiasis. The largest individual stone measures 2.7 cm. No evidence of pericholecystic fluid or gallbladder wall thickening. Per the sonographer, sonographic MPercell Millersign was negative.  Common bile duct:  Diameter: Within normal limits at 4 mm  Liver:  No focal lesion identified. Within normal limits in parenchymal echogenicity. Main portal vein is patent with normal hepatopetal flow.  IMPRESSION: Cholelithiasis without secondary sonographic findings to suggest acute cholecystitis.   Electronically Signed   By: HJacqulynn CadetM.D.   On: 1November 09, 201507:20   Mr Abd W/wo Cm/mrcp  111/09/15  CLINICAL DATA:  Abdominal pain, cholelithiasis, abnormal LFTs. Elevated lipase.  EXAM: MRI ABDOMEN WITHOUT AND WITH  CONTRAST (INCLUDING MRCP)  TECHNIQUE: Multiplanar multisequence MR imaging of the abdomen was performed both before and after the administration of intravenous contrast. Heavily T2-weighted images of the biliary and pancreatic ducts were obtained, and three-dimensional MRCP images were rendered by post processing.  CONTRAST:  253mMULTIHANCE GADOBENATE DIMEGLUMINE 529 MG/ML IV SOLN  COMPARISON:  Abdominal ultrasound dated 1110-03-21 FINDINGS: Liver is within normal limits. No suspicious/enhancing hepatic lesions. No hepatic steatosis.  Multiple layering gallstones, including a 2.6 cm gallstone in the gallbladder fundus. No gallbladder wall thickening or pericholecystic fluid.  No intrahepatic or extrahepatic ductal dilatation. Common duct measures 6 mm, within normal limits, and smoothly tapers at the ampulla. No choledocholithiasis is seen.  Peripancreatic inflammatory changes, reflecting acute pancreatitis. No evidence of pancreatic necrosis. No drainable fluid collection/ pseudocyst.  Spleen is within normal limits.  Kidneys are within normal limits.  No hydronephrosis.  Small volume abdominal ascites.  No suspicious abdominal lymphadenopathy.  No focal osseous lesions.  IMPRESSION: Acute pancreatitis, without evidence of complication.  Cholelithiasis, without evidence of acute cholecystitis.  Common duct measures 6 mm.  No choledocholithiasis is seen.  Electronically Signed   By: Julian Hy M.D.   On: 05/14/2014 17:12    Scheduled Meds: . antiseptic oral rinse  7 mL Mouth Rinse BID  . heparin  5,000 Units Subcutaneous 3 times per day  . [START ON 05/16/2014] Influenza vac split quadrivalent PF  0.5 mL Intramuscular Tomorrow-1000  . labetalol  100 mg Oral BID   Continuous Infusions: . sodium chloride 150 mL/hr at 05/15/14 0028    Principal Problem:   Gallstone pancreatitis    Colbert Ewing PA-S [facility] Pager [phone]. If 7PM-7AM, please contact night-coverage at  [email or web address], password [facility] 05/15/2014, 10:27 AM  LOS: 1 day    Attending Patient was seen, examined,treatment plan was discussed with the  Advance Practice Provider.  I have directly reviewed the clinical findings, lab, imaging studies and management of this patient in detail. I have made the necessary changes to the above noted documentation, and agree with the documentation, as recorded by the Advance Practice Provider.   Much improved, MRCP neg for CBD stones. Continue supportive care. Lap Cholecystectomy tentatively scheduled for tomorrow am.  Nena Alexander MD [facility].

## 2014-05-16 ENCOUNTER — Inpatient Hospital Stay (HOSPITAL_COMMUNITY): Payer: Medicaid Other | Admitting: Anesthesiology

## 2014-05-16 ENCOUNTER — Inpatient Hospital Stay (HOSPITAL_COMMUNITY): Payer: Medicaid Other

## 2014-05-16 ENCOUNTER — Encounter (HOSPITAL_COMMUNITY)
Admission: EM | Disposition: A | Discharge status: Home / Self Care | Payer: Self-pay | Source: Home / Self Care | Attending: Internal Medicine

## 2014-05-16 HISTORY — PX: CHOLECYSTECTOMY: SHX55

## 2014-05-16 LAB — LIPASE, BLOOD: LIPASE: 76 U/L — AB (ref 11–59)

## 2014-05-16 LAB — COMPREHENSIVE METABOLIC PANEL
ALT: 304 U/L — ABNORMAL HIGH (ref 0–35)
AST: 93 U/L — ABNORMAL HIGH (ref 0–37)
Albumin: 3.5 g/dL (ref 3.5–5.2)
Alkaline Phosphatase: 182 U/L — ABNORMAL HIGH (ref 39–117)
Anion gap: 12 (ref 5–15)
BUN: 5 mg/dL — ABNORMAL LOW (ref 6–23)
CO2: 23 mEq/L (ref 19–32)
Calcium: 8.6 mg/dL (ref 8.4–10.5)
Chloride: 105 mEq/L (ref 96–112)
Creatinine, Ser: 0.47 mg/dL — ABNORMAL LOW (ref 0.50–1.10)
GFR calc Af Amer: >90 mL/min (ref >90)
GFR calc non Af Amer: >90 mL/min (ref >90)
Glucose, Bld: 103 mg/dL — ABNORMAL HIGH (ref 70–99)
Potassium: 3.4 mEq/L — ABNORMAL LOW (ref 3.7–5.3)
Sodium: 140 mEq/L (ref 137–147)
Total Bilirubin: 1.3 mg/dL — ABNORMAL HIGH (ref 0.3–1.2)
Total Protein: 6.5 g/dL (ref 6.0–8.3)

## 2014-05-16 LAB — MRSA PCR SCREENING: MRSA BY PCR: NEGATIVE

## 2014-05-16 SURGERY — LAPAROSCOPIC CHOLECYSTECTOMY WITH INTRAOPERATIVE CHOLANGIOGRAM
Anesthesia: General | Site: Abdomen

## 2014-05-16 MED ORDER — GLYCOPYRROLATE 0.2 MG/ML IJ SOLN
INTRAMUSCULAR | Status: DC | PRN
  Administered 2014-05-16: 0.6 mg via INTRAVENOUS

## 2014-05-16 MED ORDER — NEOSTIGMINE METHYLSULFATE 10 MG/10ML IV SOLN
INTRAVENOUS | Status: DC | PRN
  Administered 2014-05-16: 4 mg via INTRAVENOUS

## 2014-05-16 MED ORDER — FENTANYL CITRATE 0.05 MG/ML IJ SOLN
INTRAMUSCULAR | Status: AC
  Filled 2014-05-16: qty 5

## 2014-05-16 MED ORDER — LACTATED RINGERS IV SOLN
INTRAVENOUS | Status: DC
  Administered 2014-05-16: 14:00:00 via INTRAVENOUS

## 2014-05-16 MED ORDER — MORPHINE SULFATE 2 MG/ML IJ SOLN
1.0000 mg | INTRAMUSCULAR | Status: DC | PRN
  Administered 2014-05-16 – 2014-05-17 (×4): 2 mg via INTRAVENOUS
  Filled 2014-05-16 (×4): qty 1

## 2014-05-16 MED ORDER — SODIUM CHLORIDE 0.9 % IV SOLN
INTRAVENOUS | Status: DC | PRN
  Administered 2014-05-16: 15 mL

## 2014-05-16 MED ORDER — BUPIVACAINE-EPINEPHRINE 0.25% -1:200000 IJ SOLN
INTRAMUSCULAR | Status: DC | PRN
  Administered 2014-05-16: 30 mL

## 2014-05-16 MED ORDER — FENTANYL CITRATE 0.05 MG/ML IJ SOLN
INTRAMUSCULAR | Status: DC | PRN
  Administered 2014-05-16: 50 ug via INTRAVENOUS
  Administered 2014-05-16: 100 ug via INTRAVENOUS
  Administered 2014-05-16 (×3): 50 ug via INTRAVENOUS

## 2014-05-16 MED ORDER — MIDAZOLAM HCL 2 MG/2ML IJ SOLN
INTRAMUSCULAR | Status: AC
  Filled 2014-05-16: qty 2

## 2014-05-16 MED ORDER — LACTATED RINGERS IV SOLN
INTRAVENOUS | Status: DC | PRN
  Administered 2014-05-16 (×2): via INTRAVENOUS

## 2014-05-16 MED ORDER — LIDOCAINE HCL (CARDIAC) 20 MG/ML IV SOLN
INTRAVENOUS | Status: DC | PRN
  Administered 2014-05-16: 50 mg via INTRAVENOUS

## 2014-05-16 MED ORDER — OXYCODONE HCL 5 MG PO TABS: 5.0000 mg | ORAL_TABLET | Freq: Once | ORAL | Status: DC | PRN

## 2014-05-16 MED ORDER — OXYCODONE HCL 5 MG PO TABS
ORAL_TABLET | ORAL | Status: AC
  Filled 2014-05-16: qty 1

## 2014-05-16 MED ORDER — HYDROMORPHONE HCL 1 MG/ML IJ SOLN
INTRAMUSCULAR | Status: AC
  Filled 2014-05-16: qty 1

## 2014-05-16 MED ORDER — CEFTRIAXONE SODIUM 2 G IJ SOLR
2.0000 g | INTRAMUSCULAR | Status: DC | PRN
  Administered 2014-05-16: 2 g via INTRAVENOUS

## 2014-05-16 MED ORDER — ONDANSETRON HCL 4 MG/2ML IJ SOLN: 4.0000 mg | Freq: Once | INTRAMUSCULAR | Status: DC | PRN

## 2014-05-16 MED ORDER — ENOXAPARIN SODIUM 40 MG/0.4ML ~~LOC~~ SOLN
40.0000 mg | SUBCUTANEOUS | Status: DC
  Filled 2014-05-16: qty 0.4

## 2014-05-16 MED ORDER — 0.9 % SODIUM CHLORIDE (POUR BTL) OPTIME
TOPICAL | Status: DC | PRN
  Administered 2014-05-16: 1000 mL

## 2014-05-16 MED ORDER — HEPARIN SODIUM (PORCINE) 5000 UNIT/ML IJ SOLN: 5000.0000 [IU] | Freq: Three times a day (TID) | INTRAMUSCULAR | Status: DC

## 2014-05-16 MED ORDER — LACTATED RINGERS IV SOLN
INTRAVENOUS | Status: DC
  Administered 2014-05-16: 15:00:00 via INTRAVENOUS

## 2014-05-16 MED ORDER — NEOSTIGMINE METHYLSULFATE 10 MG/10ML IV SOLN
INTRAVENOUS | Status: AC
  Filled 2014-05-16: qty 1

## 2014-05-16 MED ORDER — ROCURONIUM BROMIDE 50 MG/5ML IV SOLN
INTRAVENOUS | Status: AC
  Filled 2014-05-16: qty 1

## 2014-05-16 MED ORDER — SODIUM CHLORIDE 0.9 % IR SOLN
Status: DC | PRN
  Administered 2014-05-16 (×2): 1000 mL

## 2014-05-16 MED ORDER — PROPOFOL 10 MG/ML IV BOLUS
INTRAVENOUS | Status: AC
  Filled 2014-05-16: qty 20

## 2014-05-16 MED ORDER — CEFTRIAXONE SODIUM IN DEXTROSE 40 MG/ML IV SOLN
2.0000 g | INTRAVENOUS | Status: DC
  Filled 2014-05-16: qty 50

## 2014-05-16 MED ORDER — ONDANSETRON HCL 4 MG/2ML IJ SOLN
INTRAMUSCULAR | Status: AC
  Filled 2014-05-16: qty 2

## 2014-05-16 MED ORDER — MIDAZOLAM HCL 5 MG/5ML IJ SOLN
INTRAMUSCULAR | Status: DC | PRN
  Administered 2014-05-16: 2 mg via INTRAVENOUS

## 2014-05-16 MED ORDER — MEPERIDINE HCL 25 MG/ML IJ SOLN: 6.2500 mg | INTRAMUSCULAR | Status: DC | PRN

## 2014-05-16 MED ORDER — HEMOSTATIC AGENTS (NO CHARGE) OPTIME
TOPICAL | Status: DC | PRN
  Administered 2014-05-16: 1

## 2014-05-16 MED ORDER — OXYCODONE HCL 5 MG/5ML PO SOLN: 5.0000 mg | Freq: Once | ORAL | Status: DC | PRN

## 2014-05-16 MED ORDER — ROCURONIUM BROMIDE 100 MG/10ML IV SOLN
INTRAVENOUS | Status: DC | PRN
  Administered 2014-05-16: 40 mg via INTRAVENOUS

## 2014-05-16 MED ORDER — ONDANSETRON HCL 4 MG/2ML IJ SOLN
INTRAMUSCULAR | Status: DC | PRN
  Administered 2014-05-16: 4 mg via INTRAVENOUS

## 2014-05-16 MED ORDER — OXYCODONE HCL 5 MG PO TABS
5.0000 mg | ORAL_TABLET | ORAL | Status: DC | PRN
  Administered 2014-05-17 (×2): 5 mg via ORAL
  Filled 2014-05-16 (×2): qty 1

## 2014-05-16 MED ORDER — HYDROMORPHONE HCL 1 MG/ML IJ SOLN
0.2500 mg | INTRAMUSCULAR | Status: DC | PRN
  Administered 2014-05-16 (×4): 0.5 mg via INTRAVENOUS

## 2014-05-16 MED ORDER — PANTOPRAZOLE SODIUM 40 MG IV SOLR
40.0000 mg | INTRAVENOUS | Status: DC
  Filled 2014-05-16: qty 40

## 2014-05-16 MED ORDER — PROPOFOL 10 MG/ML IV BOLUS
INTRAVENOUS | Status: DC | PRN
  Administered 2014-05-16: 120 mg via INTRAVENOUS

## 2014-05-16 SURGICAL SUPPLY — 45 items
APPLIER CLIP 5 13 M/L LIGAMAX5 (MISCELLANEOUS) ×3
BANDAGE ADH SHEER 1  50/CT (GAUZE/BANDAGES/DRESSINGS) ×9 IMPLANT
BENZOIN TINCTURE PRP APPL 2/3 (GAUZE/BANDAGES/DRESSINGS) ×3 IMPLANT
BLADE SURG ROTATE 9660 (MISCELLANEOUS) IMPLANT
CANISTER SUCTION 2500CC (MISCELLANEOUS) ×3 IMPLANT
CHLORAPREP W/TINT 26ML (MISCELLANEOUS) ×3 IMPLANT
CLIP APPLIE 5 13 M/L LIGAMAX5 (MISCELLANEOUS) ×1 IMPLANT
CLOSURE WOUND 1/2 X4 (GAUZE/BANDAGES/DRESSINGS) ×1
COVER MAYO STAND STRL (DRAPES) ×3 IMPLANT
COVER SURGICAL LIGHT HANDLE (MISCELLANEOUS) ×3 IMPLANT
DECANTER SPIKE VIAL GLASS SM (MISCELLANEOUS) IMPLANT
DRAPE C-ARM 42X72 X-RAY (DRAPES) ×3 IMPLANT
DRAPE LAPAROSCOPIC ABDOMINAL (DRAPES) ×3 IMPLANT
DRSG TEGADERM 4X4.75 (GAUZE/BANDAGES/DRESSINGS) ×3 IMPLANT
ELECT REM PT RETURN 9FT ADLT (ELECTROSURGICAL) ×3
ELECTRODE REM PT RTRN 9FT ADLT (ELECTROSURGICAL) ×1 IMPLANT
GAUZE SPONGE 2X2 8PLY STRL LF (GAUZE/BANDAGES/DRESSINGS) ×1 IMPLANT
GLOVE BIOGEL M STRL SZ7.5 (GLOVE) ×3 IMPLANT
GLOVE BIOGEL PI IND STRL 8 (GLOVE) ×1 IMPLANT
GLOVE BIOGEL PI INDICATOR 8 (GLOVE) ×2
GOWN STRL REUS W/ TWL LRG LVL3 (GOWN DISPOSABLE) ×3 IMPLANT
GOWN STRL REUS W/ TWL XL LVL3 (GOWN DISPOSABLE) ×1 IMPLANT
GOWN STRL REUS W/TWL LRG LVL3 (GOWN DISPOSABLE) ×6
GOWN STRL REUS W/TWL XL LVL3 (GOWN DISPOSABLE) ×2
HEMOSTAT SNOW SURGICEL 2X4 (HEMOSTASIS) ×3 IMPLANT
KIT BASIN OR (CUSTOM PROCEDURE TRAY) ×3 IMPLANT
KIT ROOM TURNOVER OR (KITS) ×3 IMPLANT
NS IRRIG 1000ML POUR BTL (IV SOLUTION) ×3 IMPLANT
PAD ARMBOARD 7.5X6 YLW CONV (MISCELLANEOUS) ×3 IMPLANT
POUCH SPECIMEN RETRIEVAL 10MM (ENDOMECHANICALS) ×3 IMPLANT
SCISSORS LAP 5X35 DISP (ENDOMECHANICALS) ×3 IMPLANT
SET CHOLANGIOGRAPH 5 50 .035 (SET/KITS/TRAYS/PACK) ×3 IMPLANT
SET IRRIG TUBING LAPAROSCOPIC (IRRIGATION / IRRIGATOR) ×3 IMPLANT
SLEEVE ENDOPATH XCEL 5M (ENDOMECHANICALS) ×6 IMPLANT
SPECIMEN JAR SMALL (MISCELLANEOUS) ×3 IMPLANT
SPONGE GAUZE 2X2 STER 10/PKG (GAUZE/BANDAGES/DRESSINGS) ×2
STRIP CLOSURE SKIN 1/2X4 (GAUZE/BANDAGES/DRESSINGS) ×2 IMPLANT
SUT MNCRL AB 4-0 PS2 18 (SUTURE) ×6 IMPLANT
SUT VICRYL 0 UR6 27IN ABS (SUTURE) ×3 IMPLANT
TOWEL OR 17X24 6PK STRL BLUE (TOWEL DISPOSABLE) IMPLANT
TOWEL OR 17X26 10 PK STRL BLUE (TOWEL DISPOSABLE) ×3 IMPLANT
TRAY LAPAROSCOPIC (CUSTOM PROCEDURE TRAY) ×3 IMPLANT
TROCAR XCEL BLUNT TIP 100MML (ENDOMECHANICALS) ×3 IMPLANT
TROCAR XCEL NON-BLD 5MMX100MML (ENDOMECHANICALS) ×3 IMPLANT
TUBING INSUFFLATION (TUBING) ×3 IMPLANT

## 2014-05-16 NOTE — H&P (View-Only) (Signed)
Subjective: Denies n/v. States abd pain much better.   Objective: Vital signs in last 24 hours: Temp:  [98 F (36.7 C)-98.5 F (36.9 C)] 98.4 F (36.9 C) (11/09 0443) Pulse Rate:  [61-66] 66 (11/09 0443) Resp:  [16-17] 17 (11/09 0443) BP: (117-124)/(62-72) 117/69 mmHg (11/09 0443) SpO2:  [98 %-100 %] 99 % (11/09 0443)    Intake/Output from previous day: 11/08 0701 - 11/09 0700 In: 2420 [I.V.:2420] Out: -  Intake/Output this shift: Total I/O In: 538 [P.O.:538] Out: -   Alert, nontoxic, nad, smiling Soft, nt, nd, no icterus  Lab Results:   Recent Labs  05/14/14 0354 05/15/14 0423  WBC 11.3* 8.7  HGB 14.0 12.1  HCT 41.3 36.3  PLT 282 235   BMET  Recent Labs  05/14/14 0354 05/15/14 0423  NA 137 143  K 4.0 3.8  CL 100 108  CO2 23 21  GLUCOSE 113* 90  BUN 7 7  CREATININE 0.53 0.57  CALCIUM 9.1 8.3*   Hepatic Function Latest Ref Rng 05/15/2014 05/14/2014  Total Protein 6.0 - 8.3 g/dL 6.4 7.4  Albumin 3.5 - 5.2 g/dL 3.4(L) 4.1  AST 0 - 37 U/L 304(H) 839(H)  ALT 0 - 35 U/L 484(H) 601(H)  Alk Phosphatase 39 - 117 U/L 222(H) 163(H)  Total Bilirubin 0.3 - 1.2 mg/dL 1.9(H) 2.4(H)  Bilirubin, Direct 0.0 - 0.3 mg/dL 0.4(H) -     PT/INR No results for input(s): LABPROT, INR in the last 72 hours. ABG No results for input(s): PHART, HCO3 in the last 72 hours.  Invalid input(s): PCO2, PO2  Studies/Results: Mr 3d Recon At Scanner  05/14/2014   CLINICAL DATA:  Abdominal pain, cholelithiasis, abnormal LFTs. Elevated lipase.  EXAM: MRI ABDOMEN WITHOUT AND WITH CONTRAST (INCLUDING MRCP)  TECHNIQUE: Multiplanar multisequence MR imaging of the abdomen was performed both before and after the administration of intravenous contrast. Heavily T2-weighted images of the biliary and pancreatic ducts were obtained, and three-dimensional MRCP images were rendered by post processing.  CONTRAST:  55m MULTIHANCE GADOBENATE DIMEGLUMINE 529 MG/ML IV SOLN  COMPARISON:  Abdominal  ultrasound dated 05/14/2014.  FINDINGS: Liver is within normal limits. No suspicious/enhancing hepatic lesions. No hepatic steatosis.  Multiple layering gallstones, including a 2.6 cm gallstone in the gallbladder fundus. No gallbladder wall thickening or pericholecystic fluid.  No intrahepatic or extrahepatic ductal dilatation. Common duct measures 6 mm, within normal limits, and smoothly tapers at the ampulla. No choledocholithiasis is seen.  Peripancreatic inflammatory changes, reflecting acute pancreatitis. No evidence of pancreatic necrosis. No drainable fluid collection/ pseudocyst.  Spleen is within normal limits.  Kidneys are within normal limits.  No hydronephrosis.  Small volume abdominal ascites.  No suspicious abdominal lymphadenopathy.  No focal osseous lesions.  IMPRESSION: Acute pancreatitis, without evidence of complication.  Cholelithiasis, without evidence of acute cholecystitis.  Common duct measures 6 mm.  No choledocholithiasis is seen.   Electronically Signed   By: SJulian HyM.D.   On: 05/14/2014 17:12   [place]domen Limited  05/14/2014   CLINICAL DATA:  38year old female with 2 day history of nausea, vomiting and back pain  EXAM: [place]ABDOMEN LIMITED - RIGHT UPPER QUADRANT  COMPARISON:  None.  FINDINGS: Gallbladder:  Echogenic foci with posterior acoustic shadowing noted within the gallbladder lumen consistent with cholelithiasis. The largest individual stone measures 2.7 cm. No evidence of pericholecystic fluid or gallbladder wall thickening. Per the sonographer, sonographic MPercell Millersign was negative.  Common bile duct:  Diameter: Within normal limits at 4  mm  Liver:  No focal lesion identified. Within normal limits in parenchymal echogenicity. Main portal vein is patent with normal hepatopetal flow.  IMPRESSION: Cholelithiasis without secondary sonographic findings to suggest acute cholecystitis.   Electronically Signed   By: Jacqulynn Cadet M.D.   On: 05/14/2014 07:20   Mr Abd  W/wo Cm/mrcp  05/14/2014   CLINICAL DATA:  Abdominal pain, cholelithiasis, abnormal LFTs. Elevated lipase.  EXAM: MRI ABDOMEN WITHOUT AND WITH CONTRAST (INCLUDING MRCP)  TECHNIQUE: Multiplanar multisequence MR imaging of the abdomen was performed both before and after the administration of intravenous contrast. Heavily T2-weighted images of the biliary and pancreatic ducts were obtained, and three-dimensional MRCP images were rendered by post processing.  CONTRAST:  56m MULTIHANCE GADOBENATE DIMEGLUMINE 529 MG/ML IV SOLN  COMPARISON:  Abdominal ultrasound dated 05/14/2014.  FINDINGS: Liver is within normal limits. No suspicious/enhancing hepatic lesions. No hepatic steatosis.  Multiple layering gallstones, including a 2.6 cm gallstone in the gallbladder fundus. No gallbladder wall thickening or pericholecystic fluid.  No intrahepatic or extrahepatic ductal dilatation. Common duct measures 6 mm, within normal limits, and smoothly tapers at the ampulla. No choledocholithiasis is seen.  Peripancreatic inflammatory changes, reflecting acute pancreatitis. No evidence of pancreatic necrosis. No drainable fluid collection/ pseudocyst.  Spleen is within normal limits.  Kidneys are within normal limits.  No hydronephrosis.  Small volume abdominal ascites.  No suspicious abdominal lymphadenopathy.  No focal osseous lesions.  IMPRESSION: Acute pancreatitis, without evidence of complication.  Cholelithiasis, without evidence of acute cholecystitis.  Common duct measures 6 mm.  No choledocholithiasis is seen.   Electronically Signed   By: SJulian HyM.D.   On: 05/14/2014 17:12    Anti-infectives: Anti-infectives    None      Assessment/Plan: Gallstone pancreatitis  No fever, pain improved, LFTs trending down except for AP; MRI reviewed no evidence of CBD stone. Just Gallstones and pancreatitis  If cont to do well - plan on Lap Chole with IOC Tuesday Can have sips today but NPO p MN Repeat labs in  AM  ELeighton Ruff WRedmond Pulling MD, FACS General, Bariatric, & Minimally Invasive Surgery [facility] [place]  LOS: 1 day    WGayland Curry11/03/2014

## 2014-05-16 NOTE — Op Note (Signed)
Kristen Madden [id number] Nov 17, 1975 05/16/2014  Laparoscopic Cholecystectomy with IOC Procedure Note  Indications: This patient presents with symptomatic gallbladder disease and will undergo laparoscopic cholecystectomy.  Pre-operative Diagnosis: gallstone pancreatitis  Post-operative Diagnosis: Same  Surgeon: [name]   Assistants: same  Anesthesia: General endotracheal anesthesia  ASA Class: 2  Procedure Details  The patient was seen again in the Holding Room. The risks, benefits, complications, treatment options, and expected outcomes were discussed with the patient. The possibilities of reaction to medication, pulmonary aspiration, perforation of viscus, bleeding, recurrent infection, finding a normal gallbladder, the need for additional procedures, failure to diagnose a condition, the possible need to convert to an open procedure, and creating a complication requiring transfusion or operation were discussed with the patient. The likelihood of improving the patient's symptoms with return to their baseline status is good.  The patient and/or family concurred with the proposed plan, giving informed consent. The site of surgery properly noted. The patient was taken to Operating Room, identified as Kristen Madden and the procedure verified as Laparoscopic Cholecystectomy with Intraoperative Cholangiogram. A Time Out was held and the above information confirmed. Antibiotic prophylaxis was administered.   Prior to the induction of general anesthesia, antibiotic prophylaxis was administered. General endotracheal anesthesia was then administered and tolerated well. After the induction, the abdomen was prepped with Chloraprep and draped in the sterile fashion. The patient was positioned in the supine position.  Local anesthetic agent was injected into the skin near the umbilicus and an incision made. We dissected down to the abdominal fascia with blunt dissection.  The  fascia was incised vertically and we entered the peritoneal cavity bluntly.  A pursestring suture of 0-Vicryl was placed around the fascial opening.  The Hasson cannula was inserted and secured with the stay suture.  Pneumoperitoneum was then created with CO2 and tolerated well without any adverse changes in the patient's vital signs. An 5-mm port was placed in the subxiphoid position.  Two 5-mm ports were placed in the right upper quadrant. All skin incisions were infiltrated with a local anesthetic agent before making the incision and placing the trocars.   We positioned the patient in reverse Trendelenburg, tilted slightly to the patient's left.  The gallbladder was identified, the fundus grasped and retracted cephalad. Adhesions were lysed bluntly and with the electrocautery where indicated, taking care not to injure any adjacent organs or viscus. The infundibulum was grasped and retracted laterally, exposing the peritoneum overlying the triangle of Calot. This was then divided and exposed in a blunt fashion. A critical view of the cystic duct and cystic artery was obtained.  The cystic duct was clearly identified and bluntly dissected circumferentially. The cystic duct was ligated with a clip distally.   An incision was made in the cystic duct and the [facility] cholangiogram catheter introduced. The catheter was secured using a clip. A cholangiogram was then obtained which showed good visualization of the distal and proximal biliary tree with no sign of filling defects or obstruction.  Contrast flowed easily into the duodenum. The catheter was then removed.   The cystic duct was then ligated with clips and divided. The cystic artery which had been identified & dissected free was ligated with clips and divided as well.   The gallbladder was dissected from the liver bed in retrograde fashion with the electrocautery. There was a pulsatile small bleeding vessel in GB fossa which was controlled with 2 clips. The  gallbladder was removed and placed in an Endocatch  sac.  The gallbladder and Endocatch sac were then removed through the umbilical port site. The liver bed was irrigated and inspected. Hemostasis was achieved with several rounds of electrocautery. Copious irrigation was utilized and was repeatedly aspirated until clear.  I did place a piece of SNOW in the GB fossa since it took a little time to achieve hemostasis. The pursestring suture was used to close the umbilical fascia.  Another figure of eight 0 vicryl suture was placed at the umbilical fascial closure.   We again inspected the right upper quadrant for hemostasis.  The umbilical closure was inspected and there was no air leak and nothing trapped within the closure. Pneumoperitoneum was released as we removed the trocars.  4-0 Monocryl was used to close the skin.   Benzoin, steri-strips, and clean dressings were applied. The patient was then extubated and brought to the recovery room in stable condition. Instrument, sponge, and needle counts were correct at closure and at the conclusion of the case.   Findings:  Cholelithiasis; +SNoW; +critical view  Estimated Blood Loss: less than 50 mL         Drains: none         Specimens: Gallbladder           Complications: None; patient tolerated the procedure well.         Disposition: PACU - hemodynamically stable.         Condition: stable  [name]. [name], MD, FACS General, Bariatric, & Minimally Invasive Surgery [facility], [place]

## 2014-05-16 NOTE — Anesthesia Preprocedure Evaluation (Addendum)
Anesthesia Evaluation  Patient identified by MRN, date of birth, ID band Patient awake    Reviewed: Allergy & Precautions, H&P , NPO status , Patient's Chart, lab work & pertinent test results  Airway Mallampati: I  TM Distance: >3 FB Neck ROM: full    Dental  (+) Teeth Intact, Dental Advidsory Given   Pulmonary          Cardiovascular hypertension, On Medications     Neuro/Psych    GI/Hepatic   Endo/Other    Renal/GU      Musculoskeletal   Abdominal   Peds  Hematology   Anesthesia Other Findings   Reproductive/Obstetrics                           Anesthesia Physical Anesthesia Plan  ASA: II  Anesthesia Plan: General   Post-op Pain Management:    Induction: Intravenous  Airway Management Planned: Oral ETT  Additional Equipment:   Intra-op Plan:   Post-operative Plan: Extubation in OR  Informed Consent: I have reviewed the patients History and Physical, chart, labs and discussed the procedure including the risks, benefits and alternatives for the proposed anesthesia with the patient or authorized representative who has indicated his/her understanding and acceptance.   Dental Advisory Given  Plan Discussed with: CRNA and Surgeon  Anesthesia Plan Comments:        Anesthesia Quick Evaluation

## 2014-05-16 NOTE — Transfer of Care (Signed)
Immediate Anesthesia Transfer of Care Note  Patient: Kristen Madden  Procedure(s) Performed: Procedure(s): LAPAROSCOPIC CHOLECYSTECTOMY WITH INTRAOPERATIVE CHOLANGIOGRAM (N/A)  Patient Location: PACU  Anesthesia Type:General  Level of Consciousness: awake and responds to stimulation  Airway & Oxygen Therapy: Patient Spontanous Breathing and Patient connected to nasal cannula oxygen  Post-op Assessment: Report given to PACU RN and Post -op Vital signs reviewed and stable  Post vital signs: Reviewed and stable  Complications: No apparent anesthesia complications

## 2014-05-16 NOTE — Progress Notes (Signed)
PROGRESS NOTE  Kristen Madden PVV:[id number] DOB: 03/31/1976 DOA: 05/14/2014 PCP: [name], MD  HPI/Subjective: Pain under control, No N/V.   Assessment/Plan: Gallstone Pancreatitis: Significantly improved with supportive care, Lipase and LFT's down trending. Abd exam very soft. MRCP neg for choledocholithiasis. Per Surgery- Lap Chole w/ IOC tentative for today, 11/10. Pt NPO. IVF and analgesics being given.   Hypertension: Chronic condition well controled outpatient on Normodyne. Stable- BP on 11/10 114/58. Continue home meds.   DVT Prophylaxis: Heparin   Code Status:Full  Family Communication: None. Patient is agreeable to treatment of care.  Disposition Plan: Home when medically appropriate.    Consultants:  General Surgery  GI  Procedures:  Lap Chole with IOC planned for 11/10  Antibiotics:  None  Objective: Filed Vitals:   05/15/14 0443 05/15/14 1243 05/15/14 2155 05/16/14 0557  BP: 117/69 125/73 121/72 114/58  Pulse: 66 66 78 68  Temp: 98.4 F (36.9 C) 98.7 F (37.1 C) 99.1 F (37.3 C) 99.4 F (37.4 C)  TempSrc: Oral Oral Oral Oral  Resp: 17 15 16 18   Weight:      SpO2: 99% 100% 98% 98%    Intake/Output Summary (Last 24 hours) at 05/16/14 1007 Last data filed at 05/15/14 1412  Gross per 24 hour  Intake   1100 ml  Output      0 ml  Net   1100 ml   Filed Weights   05/14/14 0823  Weight: 91.9 kg (202 lb 9.6 oz)    Exam: General: Patient resting comfortably in chair, well developed, well nourished, NAD, appears stated age  HEENT:  EOMI, Anicteic Sclera, moist mucus membranes.  Neck: Supple, no JVD, no masses  Cardiovascular: RRR, S1 S2 auscultated, no rubs, murmurs or gallops.   Respiratory: Clear to auscultation bilaterally. Equal chest rise  Abdomen: Soft, Moderate tenderness to palpation throughout, nondistended, + bowel sounds  Extremities: Warm dry without cyanosis clubbing or edema.     Data Reviewed: Basic  Metabolic Panel:  Recent Labs Lab 05/14/14 0354 05/15/14 0423 05/16/14 0616  NA 137 143 140  K 4.0 3.8 3.4*  CL 100 108 105  CO2 23 21 23   GLUCOSE 113* 90 103*  BUN 7 7 5*  CREATININE 0.53 0.57 0.47*  CALCIUM 9.1 8.3* 8.6   Liver Function Tests:  Recent Labs Lab 05/14/14 0354 05/15/14 0423 05/16/14 0616  AST 839* 304* 93*  ALT 601* 484* 304*  ALKPHOS 163* 222* 182*  BILITOT 2.4* 1.9* 1.3*  PROT 7.4 6.4 6.5  ALBUMIN 4.1 3.4* 3.5    Recent Labs Lab 05/14/14 0354 05/15/14 0423  LIPASE >3000* 1102*   CBC:  Recent Labs Lab 05/14/14 0354 05/15/14 0423  WBC 11.3* 8.7  NEUTROABS 8.3*  --   HGB 14.0 12.1  HCT 41.3 36.3  MCV 87.5 88.8  PLT 282 235    Recent Results (from the past 240 hour(s))  Surgical pcr screen     Status: None   Collection Time: 05/14/14  4:59 PM  Result Value Ref Range Status   MRSA, PCR NEGATIVE NEGATIVE Final   Staphylococcus aureus NEGATIVE NEGATIVE Final    Comment:        The Xpert SA Assay (FDA approved for NASAL specimens in patients over 18 years of age), is one component of a comprehensive surveillance program.  Test performance has been validated by [employer] for patients greater than or equal to 61 year old. It is not intended to diagnose infection  nor to guide or monitor treatment.      Studies: Mr 3d Recon At Scanner  10-Jun-2014   CLINICAL DATA:  Abdominal pain, cholelithiasis, abnormal LFTs. Elevated lipase.  EXAM: MRI ABDOMEN WITHOUT AND WITH CONTRAST (INCLUDING MRCP)  TECHNIQUE: Multiplanar multisequence MR imaging of the abdomen was performed both before and after the administration of intravenous contrast. Heavily T2-weighted images of the biliary and pancreatic ducts were obtained, and three-dimensional MRCP images were rendered by post processing.  CONTRAST:  33m MULTIHANCE GADOBENATE DIMEGLUMINE 529 MG/ML IV SOLN  COMPARISON:  Abdominal ultrasound dated 105-Dec-2015  FINDINGS: Liver is within normal limits.  No suspicious/enhancing hepatic lesions. No hepatic steatosis.  Multiple layering gallstones, including a 2.6 cm gallstone in the gallbladder fundus. No gallbladder wall thickening or pericholecystic fluid.  No intrahepatic or extrahepatic ductal dilatation. Common duct measures 6 mm, within normal limits, and smoothly tapers at the ampulla. No choledocholithiasis is seen.  Peripancreatic inflammatory changes, reflecting acute pancreatitis. No evidence of pancreatic necrosis. No drainable fluid collection/ pseudocyst.  Spleen is within normal limits.  Kidneys are within normal limits.  No hydronephrosis.  Small volume abdominal ascites.  No suspicious abdominal lymphadenopathy.  No focal osseous lesions.  IMPRESSION: Acute pancreatitis, without evidence of complication.  Cholelithiasis, without evidence of acute cholecystitis.  Common duct measures 6 mm.  No choledocholithiasis is seen.   Electronically Signed   By: SJulian HyM.D.   On: 112/05/201517:12   [place]Abdomen Limited  1Dec 05, 2015  CLINICAL DATA:  38year old female with 2 day history of nausea, vomiting and back pain  EXAM: [place]ABDOMEN LIMITED - RIGHT UPPER QUADRANT  COMPARISON:  None.  FINDINGS: Gallbladder:  Echogenic foci with posterior acoustic shadowing noted within the gallbladder lumen consistent with cholelithiasis. The largest individual stone measures 2.7 cm. No evidence of pericholecystic fluid or gallbladder wall thickening. Per the sonographer, sonographic MPercell Millersign was negative.  Common bile duct:  Diameter: Within normal limits at 4 mm  Liver:  No focal lesion identified. Within normal limits in parenchymal echogenicity. Main portal vein is patent with normal hepatopetal flow.  IMPRESSION: Cholelithiasis without secondary sonographic findings to suggest acute cholecystitis.   Electronically Signed   By: HJacqulynn CadetM.D.   On: 12015/06/506:20   Mr Abd W/wo Cm/mrcp  1Dec 05, 2015  CLINICAL DATA:  Abdominal pain,  cholelithiasis, abnormal LFTs. Elevated lipase.  EXAM: MRI ABDOMEN WITHOUT AND WITH CONTRAST (INCLUDING MRCP)  TECHNIQUE: Multiplanar multisequence MR imaging of the abdomen was performed both before and after the administration of intravenous contrast. Heavily T2-weighted images of the biliary and pancreatic ducts were obtained, and three-dimensional MRCP images were rendered by post processing.  CONTRAST:  258mMULTIHANCE GADOBENATE DIMEGLUMINE 529 MG/ML IV SOLN  COMPARISON:  Abdominal ultrasound dated 1110/11/19 FINDINGS: Liver is within normal limits. No suspicious/enhancing hepatic lesions. No hepatic steatosis.  Multiple layering gallstones, including a 2.6 cm gallstone in the gallbladder fundus. No gallbladder wall thickening or pericholecystic fluid.  No intrahepatic or extrahepatic ductal dilatation. Common duct measures 6 mm, within normal limits, and smoothly tapers at the ampulla. No choledocholithiasis is seen.  Peripancreatic inflammatory changes, reflecting acute pancreatitis. No evidence of pancreatic necrosis. No drainable fluid collection/ pseudocyst.  Spleen is within normal limits.  Kidneys are within normal limits.  No hydronephrosis.  Small volume abdominal ascites.  No suspicious abdominal lymphadenopathy.  No focal osseous lesions.  IMPRESSION: Acute pancreatitis, without evidence of complication.  Cholelithiasis, without evidence of acute cholecystitis.  Common  duct measures 6 mm.  No choledocholithiasis is seen.   Electronically Signed   By: Julian Hy M.D.   On: 05/14/2014 17:12    Scheduled Meds: . antiseptic oral rinse  7 mL Mouth Rinse BID  . heparin  5,000 Units Subcutaneous 3 times per day  . Influenza vac split quadrivalent PF  0.5 mL Intramuscular Tomorrow-1000  . labetalol  100 mg Oral BID   Continuous Infusions: . sodium chloride 100 mL/hr at 05/15/14 1408    Principal Problem:   Gallstone pancreatitis    Colbert Ewing PA-S [facility] Pager  [phone]. If 7PM-7AM, please contact night-coverage at [email or web address], password [facility] 05/16/2014, 10:07 AM  LOS: 2 days    Attending Patient was seen and examined, agree with the above documentation. Significantly improved, belly is completely soft, scheduled for laparoscopic cholecystectomy today. General surgery will resume primary service, hospitalist service will sign off.  Nena Alexander MD

## 2014-05-16 NOTE — Progress Notes (Signed)
Patient ID: Kristen Madden, female   DOB: 09-06-1975, 38 y.o.   MRN: [id number] Day of Surgery  Subjective: Pt feels well.  No abdominal pain  Objective: Vital signs in last 24 hours: Temp:  [98.7 F (37.1 C)-99.4 F (37.4 C)] 99.4 F (37.4 C) (11/10 0557) Pulse Rate:  [66-78] 68 (11/10 0557) Resp:  [15-18] 18 (11/10 0557) BP: (114-125)/(58-73) 114/58 mmHg (11/10 0557) SpO2:  [98 %-100 %] 98 % (11/10 0557) Last BM Date:  (PTA)  Intake/Output from previous day: 11/09 0701 - 11/10 0700 In: 7253 [P.O.:538; I.V.:1100] Out: -  Intake/Output this shift:    PE: Abd: soft, NT, ND, +BS  Lab Results:   Recent Labs  05/14/14 0354 05/15/14 0423  WBC 11.3* 8.7  HGB 14.0 12.1  HCT 41.3 36.3  PLT 282 235   BMET  Recent Labs  05/15/14 0423 05/16/14 0616  NA 143 140  K 3.8 3.4*  CL 108 105  CO2 21 23  GLUCOSE 90 103*  BUN 7 5*  CREATININE 0.57 0.47*  CALCIUM 8.3* 8.6   PT/INR No results for input(s): LABPROT, INR in the last 72 hours. CMP     Component Value Date/Time   NA 140 05/16/2014 0616   K 3.4* 05/16/2014 0616   CL 105 05/16/2014 0616   CO2 23 05/16/2014 0616   GLUCOSE 103* 05/16/2014 0616   BUN 5* 05/16/2014 0616   CREATININE 0.47* 05/16/2014 0616   CREATININE 0.54 03/04/2012 1035   CALCIUM 8.6 05/16/2014 0616   PROT 6.5 05/16/2014 0616   ALBUMIN 3.5 05/16/2014 0616   AST 93* 05/16/2014 0616   ALT 304* 05/16/2014 0616   ALKPHOS 182* 05/16/2014 0616   BILITOT 1.3* 05/16/2014 0616   GFRNONAA >90 05/16/2014 0616   GFRAA >90 05/16/2014 0616   Lipase     Component Value Date/Time   LIPASE 76* 05/16/2014 0616       Studies/Results: Mr 3d Recon At Scanner  05/14/2014   CLINICAL DATA:  Abdominal pain, cholelithiasis, abnormal LFTs. Elevated lipase.  EXAM: MRI ABDOMEN WITHOUT AND WITH CONTRAST (INCLUDING MRCP)  TECHNIQUE: Multiplanar multisequence MR imaging of the abdomen was performed both before and after the administration of intravenous  contrast. Heavily T2-weighted images of the biliary and pancreatic ducts were obtained, and three-dimensional MRCP images were rendered by post processing.  CONTRAST:  20m MULTIHANCE GADOBENATE DIMEGLUMINE 529 MG/ML IV SOLN  COMPARISON:  Abdominal ultrasound dated 05/14/2014.  FINDINGS: Liver is within normal limits. No suspicious/enhancing hepatic lesions. No hepatic steatosis.  Multiple layering gallstones, including a 2.6 cm gallstone in the gallbladder fundus. No gallbladder wall thickening or pericholecystic fluid.  No intrahepatic or extrahepatic ductal dilatation. Common duct measures 6 mm, within normal limits, and smoothly tapers at the ampulla. No choledocholithiasis is seen.  Peripancreatic inflammatory changes, reflecting acute pancreatitis. No evidence of pancreatic necrosis. No drainable fluid collection/ pseudocyst.  Spleen is within normal limits.  Kidneys are within normal limits.  No hydronephrosis.  Small volume abdominal ascites.  No suspicious abdominal lymphadenopathy.  No focal osseous lesions.  IMPRESSION: Acute pancreatitis, without evidence of complication.  Cholelithiasis, without evidence of acute cholecystitis.  Common duct measures 6 mm.  No choledocholithiasis is seen.   Electronically Signed   By: SJulian HyM.D.   On: 05/14/2014 17:12   Mr Abd W/wo Cm/mrcp  05/14/2014   CLINICAL DATA:  Abdominal pain, cholelithiasis, abnormal LFTs. Elevated lipase.  EXAM: MRI ABDOMEN WITHOUT AND WITH CONTRAST (INCLUDING MRCP)  TECHNIQUE: Multiplanar  multisequence MR imaging of the abdomen was performed both before and after the administration of intravenous contrast. Heavily T2-weighted images of the biliary and pancreatic ducts were obtained, and three-dimensional MRCP images were rendered by post processing.  CONTRAST:  77m MULTIHANCE GADOBENATE DIMEGLUMINE 529 MG/ML IV SOLN  COMPARISON:  Abdominal ultrasound dated 05/14/2014.  FINDINGS: Liver is within normal limits. No  suspicious/enhancing hepatic lesions. No hepatic steatosis.  Multiple layering gallstones, including a 2.6 cm gallstone in the gallbladder fundus. No gallbladder wall thickening or pericholecystic fluid.  No intrahepatic or extrahepatic ductal dilatation. Common duct measures 6 mm, within normal limits, and smoothly tapers at the ampulla. No choledocholithiasis is seen.  Peripancreatic inflammatory changes, reflecting acute pancreatitis. No evidence of pancreatic necrosis. No drainable fluid collection/ pseudocyst.  Spleen is within normal limits.  Kidneys are within normal limits.  No hydronephrosis.  Small volume abdominal ascites.  No suspicious abdominal lymphadenopathy.  No focal osseous lesions.  IMPRESSION: Acute pancreatitis, without evidence of complication.  Cholelithiasis, without evidence of acute cholecystitis.  Common duct measures 6 mm.  No choledocholithiasis is seen.   Electronically Signed   By: SJulian HyM.D.   On: 05/14/2014 17:12    Anti-infectives: Anti-infectives    Start     Dose/Rate Route Frequency Ordered Stop   05/16/14 1130  cefTRIAXone (ROCEPHIN) 2 g in dextrose 5 % 50 mL IVPB - Premix     2 g100 mL/hr over 30 Minutes Intravenous On call to O.R. 05/16/14 1112 05/17/14 0559       Assessment/Plan  1. Gallstone pancreatitis  Plan: 1. Lipase down to 79.  Will proceed to OR today for lap chole   LOS: 2 days    Kimm Sider E 05/16/2014, 11:17 AM Pager: [phone]

## 2014-05-16 NOTE — Anesthesia Postprocedure Evaluation (Signed)
  Anesthesia Post-op Note  Patient: Kristen Madden  Procedure(s) Performed: Procedure(s): LAPAROSCOPIC CHOLECYSTECTOMY WITH INTRAOPERATIVE CHOLANGIOGRAM (N/A)  Patient Location: PACU  Anesthesia Type:General  Level of Consciousness: sedated, patient cooperative and responds to stimulation  Airway and Oxygen Therapy: Patient Spontanous Breathing and Patient connected to nasal cannula oxygen  Post-op Pain: mild, able to sleep  Post-op Assessment: Post-op Vital signs reviewed, Patient's Cardiovascular Status Stable, Respiratory Function Stable, Patent Airway, No signs of Nausea or vomiting and Pain level controlled  Post-op Vital Signs: Reviewed and stable  Last Vitals:  Filed Vitals:   05/16/14 1800  BP: 120/64  Pulse: 68  Temp: 36.7 C  Resp: 15    Complications: No apparent anesthesia complications

## 2014-05-16 NOTE — Interval H&P Note (Signed)
History and Physical Interval Note:  05/16/2014 2:17 PM  Kristen Madden Done  has presented today for surgery, with the diagnosis of Gallstone Pancreatitis  The various methods of treatment have been discussed with the patient and family. After consideration of risks, benefits and other options for treatment, the patient has consented to  Procedure(s): LAPAROSCOPIC CHOLECYSTECTOMY WITH INTRAOPERATIVE CHOLANGIOGRAM (N/A) as a surgical intervention .  The patient's history has been reviewed, patient examined, no change in status, stable for surgery.  I have reviewed the patient's chart and labs.  Questions were answered to the patient's satisfaction.    [name]. [name], MD, [place], Bariatric, & Minimally Invasive Surgery [facility], [place]   [facility]

## 2014-05-17 ENCOUNTER — Encounter (HOSPITAL_COMMUNITY): Payer: Self-pay | Admitting: General Surgery

## 2014-05-17 LAB — CBC
HEMATOCRIT: 31.9 % — AB (ref 36.0–46.0)
HEMOGLOBIN: 11 g/dL — AB (ref 12.0–15.0)
MCH: 30.3 pg (ref 26.0–34.0)
MCHC: 34.5 g/dL (ref 30.0–36.0)
MCV: 87.9 fL (ref 78.0–100.0)
Platelets: 239 10*3/uL (ref 150–400)
RBC: 3.63 MIL/uL — AB (ref 3.87–5.11)
RDW: 13.3 % (ref 11.5–15.5)
WBC: 11.3 10*3/uL — ABNORMAL HIGH (ref 4.0–10.5)

## 2014-05-17 MED ORDER — PANTOPRAZOLE SODIUM 40 MG PO TBEC
40.0000 mg | DELAYED_RELEASE_TABLET | Freq: Every day | ORAL | Status: DC
  Administered 2014-05-17: 40 mg via ORAL

## 2014-05-17 MED ORDER — OXYCODONE HCL 5 MG PO TABS: 5.0000 mg | ORAL_TABLET | ORAL | Status: DC | PRN

## 2014-05-17 NOTE — Plan of Care (Signed)
Problem: Discharge Progression Outcomes Goal: Discharge plan in place and appropriate Outcome: Completed/Met Date Met:  05/17/14 Goal: Pain controlled with appropriate interventions Outcome: Completed/Met Date Met:  05/17/14 Goal: Hemodynamically stable Outcome: Completed/Met Date Met:  76/19/50 Goal: Complications resolved/controlled Outcome: Completed/Met Date Met:  05/17/14 Goal: Tolerating diet Outcome: Completed/Met Date Met:  05/17/14 Goal: Activity appropriate for discharge plan Outcome: Completed/Met Date Met:  05/17/14 Goal: Tubes and drains discontinued if indicated Outcome: Completed/Met Date Met:  05/17/14 Goal: Staples/sutures removed Outcome: Not Applicable Date Met:  93/26/71 Goal: Steri-Strips applied Outcome: Not Applicable Date Met:  24/58/09

## 2014-05-17 NOTE — Discharge Instructions (Signed)
CIRUGIA LAPAROSCOPICA: INSTRUCCIONES DE POST OPERATORIO.  Revise siempre los documentos que le entreguen en el lugar donde se ha hecho la [place].  SI USTED NECESITA DOCUMENTOS DE INCAPACIDAD (DISABLE) O DE PERMISO FAMILAR (FAMILY LEAVE) NECESITA TRAERLOS A LA OFICINA PARA QUE SEAN PROCESADOS. NO  SE LOS DE A SU DOCTOR. 1. A su alta del hospital se le dara una receta para Financial controller. Tomela como ha sido recetada, si la necesita. Si no la necesita puede tomar, Acetaminofen (Tylenol) o Ibuprofen (Advil) para aliviar dolor moderado. 2. Continue tomando el resto de sus medicinas. 3. Si necesita rellenar la receta, llame a la farmacia. ellos contactan a nuestra oficina pidiendo autorizacion. Este tipo de receta no pueden ser [employer] de las  5pm o [employer] fines de [place]. 4. Con relacion a la dieta: debe ser [employer] primeros dias despues que llege a la casa. Ejemplo: sopas y galleticas. Tome bastante liquido esos dias. 5. La mayoria de los pacientes padecen de inflamacion y cambio de coloracion de la piel alrededor de las incisiones. esto toma dias en resolver.  pnerse una bolsa de hielo en el area affectada ayuda..  6. Es comun tambien tener un poco de estrenimiento si esta tomado medicinas para Conservation officer, historic buildings. incremente la cantidad de liquidos a tomar y Doctor, hospital ([place]) esto previene el problema. Si ya tiene estrenimiento, es Software engineer no ha defecado en 48 horas, puede tomar un laxativo (Milk of Magnesia or Miralax) uselo como el paquete le explica. 7.  A menos que se le diga algo diferente. Remueva el bendaje a las 24-48 horas despues dela [place]. y puede banarse en la ducha sin ningun problema. usted puede tener steri-strips (pequenas curitas transparentes en la piel puesta encima de la incision)  Estas banditas strips should be left on the skin for 7-10 days.   Si su cirujano puso pegamento encima de la incision usted puede banarse bajo la ducha en 24 horas. Este pegamento empezara a  caerse en las proximas 2-3 semanas. Si le pusieron suturas o presillas (grapos) estos seran quitados en su proxima cita en la oficina. Marland Kitchen a. ACTIVIDADES:  Puede hacer actividad ligera.  Como caminar , subir escaleras y poco a poco irlas incrementando tanto como las [place]. Puede tener relaciones sexuales cuando sea comfortable. No carge objetos pesados o haga esfuerzos que no sean aprovados por su doctor. b. Puede manejar en cuanto no esta tomando medicamentos fuertes (narcoticos) para Conservation officer, historic buildings, pueda abrochar confortablemente el cinturon de seguridad, y pueda Psychologist, counselling y usar los pedales de su vehiculo con seguridad. c. [place]  8. Debe ver a su doctor para una cita de seguimiento en 2-3 semanas despues de la [place].  9. OTRAS ISNSTRUCCIONES:___________________________________________________________________________________ Angelina Pih A SU MEDICO: 1. FIEBRE mayor de  101.0 2. No produccion de [place]. 3. Sangramiento continue de la herida 4. Incremento de dolor, enrojecimientio o drenaje de la herida (incision) 5. Incremento de dolor abdominal.  The clinic staff is available to answer your questions during regular business hours.  Please dont hesitate to call and ask to speak to one of the nurses for clinical concerns.  If you have a medical emergency, go to the nearest emergency room or call 911.  A surgeon from [facility] is always on call at the hospital. [address] [place], [place], [place]  [address] ? [address], [place], [place]   [address] [phone] ? [phone] ? FAX [phone] Web site: [email or web address]

## 2014-05-17 NOTE — Discharge Summary (Signed)
Physician Discharge Summary  Kristen Madden SVX:[id number] DOB: 1976/01/28 DOA: 05/14/2014  PCP: [name], MD  Admit date: 05/14/2014 Discharge date: 05/17/2014  Recommendations for Outpatient Follow-up:   Follow-up Information    Follow up with [facility] On 06/06/2014.   Why:  3:45pm, arrive no later than 3:15pm for paperwork   Contact information:   [place]   [place] [address] [phone]      Discharge Diagnoses:  Principal Problem:   Gallstone pancreatitis Active Problems:   HTN (hypertension)  Surgical Procedure: Laparoscopic cholecystectomy with IOC 05/16/14  Discharge Condition: good Disposition: home  Diet recommendation: regular  Filed Weights   05/14/14 0823 05/17/14 0806  Weight: 202 lb 9.6 oz (91.9 kg) 202 lb 9.6 oz (91.9 kg)    History of present illness:  Kristen Madden is a 38 y.o. female with Past Medical History of HTN who presents today with the above noted complaint.Per patient,she's been having epigastric pain for the past 2 days. Pain is 8/10 at its worst, no radiation, associated with several episodes of vomiting. She presented to the ED today, where lab work was consistent with pancreatitis. Abdominal ultrasound shows gallstones. I was subsequently asked to admit this patient for further evaluation and treatment. No fever, headache, chest pain, shortness of breath. No diarrhea. She was admitted to triad hospitalist service  Hospital Course:  She was started on IV fluid and pain control and VTE prophylaxis. Her LFTs and lipase trended downward and was felt ready for surgery on 11/10. Please see op note. Postop she was transferred to [place] service. On POD 1, she was ambulating without difficulty. Her pain was controlled with oral meds. She was tolerating a diet. She had no fever or tachycardia. She denied dizziness, nausea, vomiting. We discussed discharge instructions and copy given to her in  [place].   BP 119/62 mmHg  Pulse 69  Temp(Src) 99.3 F (37.4 C) (Oral)  Resp 15  Ht 5' 7"  (1.702 m)  Wt 202 lb 9.6 oz (91.9 kg)  BMI 31.72 kg/m2  SpO2 95%  LMP 05/14/2014  Gen: alert, NAD, non-toxic appearing Pupils: equal, no scleral icterus Pulm: Lungs clear to auscultation, symmetric chest rise CV: regular rate and rhythm Abd: soft, mild tenderness at umbilicus, nondistended. Dressing c/d/i. No cellulitis. No incisional hernia Ext: no edema, no calf tenderness Skin: no rash, no jaundice  Discharge Instructions  Discharge Instructions    Discharge instructions    Complete by:  As directed   See [facility] discharge instructions     Increase activity slowly    Complete by:  As directed             Medication List    TAKE these medications        labetalol 100 MG tablet  Commonly known as:  NORMODYNE  Take 1 tablet (100 mg total) by mouth 2 (two) times daily.     oxyCODONE 5 MG immediate release tablet  Commonly known as:  Oxy IR/ROXICODONE  Take 1-2 tablets (5-10 mg total) by mouth every 4 (four) hours as needed for moderate pain.     prenatal multivitamin Tabs tablet  Take 1 tablet by mouth daily at 12 noon.           Follow-up Information    Follow up with [facility] On 06/06/2014.   Why:  3:45pm, arrive no later than 3:15pm for paperwork   Contact information:  [address]   [place] [address] [phone]        The results of significant diagnostics from this hospitalization (including imaging, microbiology, ancillary and laboratory) are listed below for reference.    Significant Diagnostic Studies: Dg Cholangiogram Operative  05/16/2014   CLINICAL DATA:  Gallstone pancreatitis  EXAM: INTRAOPERATIVE CHOLANGIOGRAM  TECHNIQUE: Cholangiographic images from the C-arm fluoroscopic device were submitted for interpretation post-operatively. Please see the procedural report for the amount of contrast and the fluoroscopy time  utilized.  COMPARISON:  05/14/2014  FINDINGS: Intraoperative cholangiogram performed during laparoscopic cholecystectomy. Intrahepatic ducts, biliary confluence, common hepatic duct, cystic duct, common bile duct, and pancreatic duct are all patent. Contrast drains into the duodenum. No dilatation, obstruction or filling defect.  IMPRESSION: Patent biliary system.   Electronically Signed   By: Daryll Brod M.D.   On: 05/16/2014 16:32   Mr 3d Recon At Scanner  05/14/2014   CLINICAL DATA:  Abdominal pain, cholelithiasis, abnormal LFTs. Elevated lipase.  EXAM: MRI ABDOMEN WITHOUT AND WITH CONTRAST (INCLUDING MRCP)  TECHNIQUE: Multiplanar multisequence MR imaging of the abdomen was performed both before and after the administration of intravenous contrast. Heavily T2-weighted images of the biliary and pancreatic ducts were obtained, and three-dimensional MRCP images were rendered by post processing.  CONTRAST:  46m MULTIHANCE GADOBENATE DIMEGLUMINE 529 MG/ML IV SOLN  COMPARISON:  Abdominal ultrasound dated 05/14/2014.  FINDINGS: Liver is within normal limits. No suspicious/enhancing hepatic lesions. No hepatic steatosis.  Multiple layering gallstones, including a 2.6 cm gallstone in the gallbladder fundus. No gallbladder wall thickening or pericholecystic fluid.  No intrahepatic or extrahepatic ductal dilatation. Common duct measures 6 mm, within normal limits, and smoothly tapers at the ampulla. No choledocholithiasis is seen.  Peripancreatic inflammatory changes, reflecting acute pancreatitis. No evidence of pancreatic necrosis. No drainable fluid collection/ pseudocyst.  Spleen is within normal limits.  Kidneys are within normal limits.  No hydronephrosis.  Small volume abdominal ascites.  No suspicious abdominal lymphadenopathy.  No focal osseous lesions.  IMPRESSION: Acute pancreatitis, without evidence of complication.  Cholelithiasis, without evidence of acute cholecystitis.  Common duct measures 6 mm.  No  choledocholithiasis is seen.   Electronically Signed   By: SJulian HyM.D.   On: 05/14/2014 17:12   UKoreaAbdomen Limited  05/14/2014   CLINICAL DATA:  38year old female with 2 day history of nausea, vomiting and back pain  EXAM: [place]ABDOMEN LIMITED - RIGHT UPPER QUADRANT  COMPARISON:  None.  FINDINGS: Gallbladder:  Echogenic foci with posterior acoustic shadowing noted within the gallbladder lumen consistent with cholelithiasis. The largest individual stone measures 2.7 cm. No evidence of pericholecystic fluid or gallbladder wall thickening. Per the sonographer, sonographic MPercell Millersign was negative.  Common bile duct:  Diameter: Within normal limits at 4 mm  Liver:  No focal lesion identified. Within normal limits in parenchymal echogenicity. Main portal vein is patent with normal hepatopetal flow.  IMPRESSION: Cholelithiasis without secondary sonographic findings to suggest acute cholecystitis.   Electronically Signed   By: HJacqulynn CadetM.D.   On: 05/14/2014 07:20   Mr Abd W/wo Cm/mrcp  05/14/2014   CLINICAL DATA:  Abdominal pain, cholelithiasis, abnormal LFTs. Elevated lipase.  EXAM: MRI ABDOMEN WITHOUT AND WITH CONTRAST (INCLUDING MRCP)  TECHNIQUE: Multiplanar multisequence MR imaging of the abdomen was performed both before and after the administration of intravenous contrast. Heavily T2-weighted images of the biliary and pancreatic ducts were obtained, and three-dimensional MRCP images were rendered by post processing.  CONTRAST:  21m MULTIHANCE GADOBENATE DIMEGLUMINE 529 MG/ML IV SOLN  COMPARISON:  Abdominal ultrasound dated 05/14/2014.  FINDINGS: Liver is within normal limits. No suspicious/enhancing hepatic lesions. No hepatic steatosis.  Multiple layering gallstones, including a 2.6 cm gallstone in the gallbladder fundus. No gallbladder wall thickening or pericholecystic fluid.  No intrahepatic or extrahepatic ductal dilatation. Common duct measures 6 mm, within normal limits, and smoothly  tapers at the ampulla. No choledocholithiasis is seen.  Peripancreatic inflammatory changes, reflecting acute pancreatitis. No evidence of pancreatic necrosis. No drainable fluid collection/ pseudocyst.  Spleen is within normal limits.  Kidneys are within normal limits.  No hydronephrosis.  Small volume abdominal ascites.  No suspicious abdominal lymphadenopathy.  No focal osseous lesions.  IMPRESSION: Acute pancreatitis, without evidence of complication.  Cholelithiasis, without evidence of acute cholecystitis.  Common duct measures 6 mm.  No choledocholithiasis is seen.   Electronically Signed   By: SJulian HyM.D.   On: 05/14/2014 17:12    Microbiology: Recent Results (from the past 240 hour(s))  Surgical pcr screen     Status: None   Collection Time: 05/14/14  4:59 PM  Result Value Ref Range Status   MRSA, PCR NEGATIVE NEGATIVE Final   Staphylococcus aureus NEGATIVE NEGATIVE Final    Comment:        The Xpert SA Assay (FDA approved for NASAL specimens in patients over 244years of age), is one component of a comprehensive surveillance program.  Test performance has been validated by SEMCORfor patients greater than or equal to 113year old. It is not intended to diagnose infection nor to guide or monitor treatment.   MRSA PCR Screening     Status: None   Collection Time: 05/16/14 11:25 AM  Result Value Ref Range Status   MRSA by PCR NEGATIVE NEGATIVE Final    Comment:        The GeneXpert MRSA Assay (FDA approved for NASAL specimens only), is one component of a comprehensive MRSA colonization surveillance program. It is not intended to diagnose MRSA infection nor to guide or monitor treatment for MRSA infections.      Labs: Basic Metabolic Panel:  Recent Labs Lab 05/14/14 0354 05/15/14 0423 05/16/14 0616  NA 137 143 140  K 4.0 3.8 3.4*  CL 100 108 105  CO2 23 21 23   GLUCOSE 113* 90 103*  BUN 7 7 5*  CREATININE 0.53 0.57 0.47*  CALCIUM 9.1 8.3* 8.6    Liver Function Tests:  Recent Labs Lab 05/14/14 0354 05/15/14 0423 05/16/14 0616  AST 839* 304* 93*  ALT 601* 484* 304*  ALKPHOS 163* 222* 182*  BILITOT 2.4* 1.9* 1.3*  PROT 7.4 6.4 6.5  ALBUMIN 4.1 3.4* 3.5    Recent Labs Lab 05/14/14 0354 05/15/14 0423 05/16/14 0616  LIPASE >3000* 1102* 76*   No results for input(s): AMMONIA in the last 168 hours. CBC:  Recent Labs Lab 05/14/14 0354 05/15/14 0423 05/17/14 0503  WBC 11.3* 8.7 11.3*  NEUTROABS 8.3*  --   --   HGB 14.0 12.1 11.0*  HCT 41.3 36.3 31.9*  MCV 87.5 88.8 87.9  PLT 282 235 239   Cardiac Enzymes: No results for input(s): CKTOTAL, CKMB, CKMBINDEX, TROPONINI in the last 168 hours. BNP: BNP (last 3 results) No results for input(s): PROBNP in the last 8760 hours. CBG: No results for input(s): GLUCAP in the last 168 hours.  Principal Problem:   Gallstone pancreatitis Active Problems:   HTN (hypertension)   Time  coordinating discharge: 10 minutes  Signed:  [name], MD [facility], [place] 05/17/2014, 4:32 PM

## 2014-05-17 NOTE — Progress Notes (Signed)
Discharge instructions gone over with patient, using interpretor when necessary. Prescription given. Follow up appointment to be made. Home medications gone over. Diet, activity, and incisional care gone over. Instructions also printed in Spanish for patient. Reasons to call the doctor gone over. Patient verbalized understanding of instructions.

## 2014-06-14 ENCOUNTER — Ambulatory Visit (INDEPENDENT_AMBULATORY_CARE_PROVIDER_SITE_OTHER): Payer: Self-pay | Admitting: Family Medicine

## 2014-06-14 VITALS — BP 128/72 | HR 75 | Temp 98.6°F | Resp 18 | Ht 64.0 in | Wt 194.0 lb

## 2014-06-14 DIAGNOSIS — I10 Essential (primary) hypertension: Secondary | ICD-10-CM

## 2014-06-14 DIAGNOSIS — N898 Other specified noninflammatory disorders of vagina: Secondary | ICD-10-CM

## 2014-06-14 DIAGNOSIS — B356 Tinea cruris: Secondary | ICD-10-CM

## 2014-06-14 LAB — POCT UA - MICROSCOPIC ONLY
Casts, Ur, LPF, POC: NEGATIVE
Crystals, Ur, HPF, POC: NEGATIVE
Mucus, UA: NEGATIVE
Yeast, UA: NEGATIVE

## 2014-06-14 LAB — POCT URINALYSIS DIPSTICK
Bilirubin, UA: NEGATIVE
Blood, UA: NEGATIVE
Glucose, UA: NEGATIVE
Ketones, UA: NEGATIVE
Leukocytes, UA: NEGATIVE
Nitrite, UA: NEGATIVE
Protein, UA: NEGATIVE
Spec Grav, UA: 1.01
Urobilinogen, UA: 0.2
pH, UA: 6

## 2014-06-14 LAB — POCT WET PREP WITH KOH
KOH Prep POC: NEGATIVE
Trichomonas, UA: NEGATIVE
Yeast Wet Prep HPF POC: NEGATIVE

## 2014-06-14 MED ORDER — FLUCONAZOLE 150 MG PO TABS: 150.0000 mg | ORAL_TABLET | Freq: Once | ORAL | Status: DC

## 2014-06-14 MED ORDER — LABETALOL HCL 100 MG PO TABS: 100.0000 mg | ORAL_TABLET | Freq: Two times a day (BID) | ORAL | Status: DC

## 2014-06-14 MED ORDER — METRONIDAZOLE 500 MG PO TABS: 500.0000 mg | ORAL_TABLET | Freq: Two times a day (BID) | ORAL | Status: DC

## 2014-06-14 NOTE — Patient Instructions (Signed)
Tia corporal  (Body Ringworm) Se puede comprar Lotrimin Crema para quittar la comecon afuera de la vagina.  No nesesita una receta para esta crema  La tia corporal (tinea corporis) es una infeccin por hongos en la piel del cuerpo. La causa de esta infeccin no son gusanos, sino un hongo. Los hongos normalmente viven en la superficie de la piel y pueden ser tiles. Sin embargo, en el caso de la [place], los hongos crecen de [place] descontrolada y causan una infeccin en la piel. Puede afectar a cualquier zona de la piel del cuerpo y puede propagarse fcilmente de Ardelia Mems persona a otra (es contagiosa). La tia es un problema frecuente en los nios, pero tambin puede afectar a los adultos. Tambin generalmente la sufren los atletas, en especial en los luchadores que comparten equipos y colchonetas.  CAUSAS  La causa de la tia corporal es un hongo llamado dermatofito. Se puede propagar a travs de:   Contacto con [employer] infectadas.  Contacto con mascotas infectadas.  Tocar o compartir objetos que [employer] en contacto con una persona o con una mascota infectada (sombreros, peines, toallas, ropa, artculos deportivos). SNTOMAS   Picazn, manchas rojas elevadas o bultos en la piel.  Erupcin en forma de anillos.  Enrojecimiento cerca del borde de la erupcin con un centro claro.  Piel seca y escamosa dentro o alrededor de la erupcin. No todas las personas tienen una erupcin en forma de [place]. Algunos desarrollan slo manchas rojas y escamosas.  DIAGNSTICO  Generalmente, la tia puede diagnosticarse mediante la realizacin de un examen de la piel. El mdico puede optar por realizar un raspado de la piel de la zona afectada. La muestra se examinar con un microscopio para determinar si hay hongos. TRATAMIENTO  La tia corporal puede tratarse con una crema o ungento antifngico tpico. En algunos casos, se indica un champ antihongos para el cuerpo. Podrn recetarle medicamentos  antimicticos para tomar por boca si la tia es grave, si reaparece o si se prolonga por mucho tiempo.  [place] slo medicamentos de venta libre o recetados, segn las indicaciones del mdico.  Stacy Gardner el rea afectada y seque bien antes de aplicar la crema o la pomada.  Cuando use el champ antimictico para tratar la tia, deje el [employer] cuerpo durante 3 a 5 minutos antes de enjuagar.   Use ropa suelta para evitar roces e irritacin en la erupcin.  Lave o cambie sus sbanas cada noche mientras tiene la erupcin.  Si su mascota tiene la misma infeccin, hgalo tratar por un veterinario. Para prevenir la tia corporal:   Mantenga una buena higiene.  Use sandalias o zapatos en lugares pblicos y duchas.  No comparta artculos personales con [employer].  Evite tocar las manchas rojas de piel de Producer, television/film/video.  Evite tocar las [employer] tienen zonas sin pelos o lvese las manos despus de tocarlo. SOLICITE ATENCIN MDICA SI:   La erupcin contina diseminndose despus de 7 das de [place].  La erupcin no se cura en el trmino de 4 semanas.  El rea alrededor de la erupcin se vuelve roja, se hincha o duele. Document Released: 04/02/2005 Document Revised: 03/17/2012 [facility] Patient Information 2015 [place]. This information is not intended to replace advice given to you by your health care provider. Make sure you discuss any questions you have with your health care provider.    Vaginitis monilisica (Monilial Vaginitis) La vaginitis es Ardelia Mems  inflamacin (irritacin, hinchazn) de la vagina y la vulva. Esta no es una enfermedad de transmisin sexual.  CAUSAS Este tipo de vaginitis lo causa un hongo (candida) que normalmente se encuentra en la vagina. El hongo candida se ha desarrollado hasta el punto de ocasionar problemas en el equilibrio qumico. SNTOMAS  Secrecin vaginal espesa y blanca.  Hinchazn,  picazn, enrojecimiento e inflamacin de la vagina y en algunos casos de los labios vaginales (vulva).  Ardor o dolor al [employer].  Dolor en [place]. DIAGNSTICO Los factores que favorecen la vaginitis moniliasica son:  Kyla Balzarine de virginidad y postmenopusicas.  Embarazo.  Infecciones.  Sentir cansancio, estar enferma o estresada, especialmente si ya ha sufrido este problema en el pasado.  Diabetes Buen control ayudar a disminur la probabilidad.  Pldoras anticonceptivas  Ropa interior [place].  El uso de espumas de bao, aerosoles femeninos duchas vaginales o tampones con desodorante.  Algunos antibiticos (medicamentos que destruyen grmenes).  Si contrae alguna enfermedad puede sufrir recurrencias espordicas. [place] profesional que lo asiste prescribir medicamentos.  Hay diferentes tipos de cremas y supositorios vaginales que tratan especficamente la vaginitis monilisica. Para infecciones por hongos recurrentes, utilice un supositorio o crema en la vagina dos veces por semana, o segn se le indique.  Tambin podrn utilizarse cremas con corticoides o anti monilisicas para la picazn o la irritacin de la vulva. Consulte con el profesional que la asiste.  Si la crema no da resultado, podr aplicarse en la vagina una solucin con azul de metileno.  El consumo de yogur puede prevenir este tipo de vaginitis. INSTRUCCIONES [place] todos los medicamentos tal como se le indic.  No mantenga relaciones sexuales hasta que el tratamiento se haya completado, o segn las indicaciones del profesional que la asiste.  Tome baos de asiento tibios.  No se aplique duchas vaginales.  No utilice tampones, especialmente los perfumados.  Use ropa interior de algodn  [employer] pantalones ajustados y las medias tipo panty.  Comunique a sus compaeros sexuales que sufre una infeccin por hongos. Ellos deben concurrir para un  control mdico si tienen sntomas como una urticaria leve o picazn.  Sus compaeros sexuales deben tratarse tambin si la infeccin es difcil de Radiographer, therapeutic.  Practique el sexo seguro - use condones  Algunos medicamentos vaginales ocasionan fallas en los condones de ltex. Los medicamentos vaginales que pueden daar los condones son:  Building services engineer cleocina  Butoconazole (Femstat)  Terconazole (Terazol) supositorios vaginales  Miconazole (Monistat) (es un medicamento de venta libre) SOLICITE ATENCIN MDICA SI:  Waldron Session tiene una temperatura oral de ms de 38,9 C (102 F).  Si la infeccin empeora luego de 2 das de tratamiento.  Si la infeccin no mejora luego de 3 das de tratamiento.  Aparecen ampollas en o alrededor de la vagina.  Si aparece una hemorragia vaginal y no es el momento del perodo.  Siente dolor al [employer].  Presenta problemas intestinales.  Tiene dolor durante las [employer]. Document Released: 04/02/2005 Document Revised: 09/15/2011 [facility] Patient Information 2015 [place]. This information is not intended to replace advice given to you by your health care provider. Make sure you discuss any questions you have with your health care provider.

## 2014-06-14 NOTE — Progress Notes (Signed)
This chart was scribed for Kristen Haber, MD by [name], ED Scribe. This patient was seen in room 10.      Patient ID: Kristen Madden MRN: [id number], DOB: [birth year], 38 y.o. Date of Encounter: 06/14/2014, 5:23 PM  Primary Physician: [name], MD  Chief Complaint: vaginal itching  HPI: 38 y.o. year old female with history below presents with vaginal itching and burning status post returning home after cholecystectomy in November. She states her last period was on 12/2. She denies diabetes  She reports hsitory of HTN for 6 years since her first pregnancy. G:2, P:2  Past Medical History  Diagnosis Date   Hypertension    Ovarian cyst      Home Meds: Prior to Admission medications   Medication Sig Start Date End Date Taking? Authorizing Provider  labetalol (NORMODYNE) 100 MG tablet Take 1 tablet (100 mg total) by mouth 2 (two) times daily. 06/28/13  Yes [name], MD  oxyCODONE (OXY IR/ROXICODONE) 5 MG immediate release tablet Take 1-2 tablets (5-10 mg total) by mouth every 4 (four) hours as needed for moderate pain. Patient not taking: Reported on 06/14/2014 05/17/14   [name], MD  Prenatal Vit-Fe Fumarate-FA (PRENATAL MULTIVITAMIN) TABS tablet Take 1 tablet by mouth daily at 12 noon.    Historical Provider, MD    Allergies: No Known Allergies  History   Social History   Marital Status: Married    Spouse Name: N/A    Number of Children: N/A   Years of Education: N/A   Occupational History   Not on file.   Social History Main Topics   Smoking status: Never Smoker    Smokeless tobacco: Never Used   Alcohol Use: No   Drug Use: No   Sexual Activity: Yes    Birth Web designer: Condom   Other Topics Concern   Not on file   Social History Narrative     Review of Systems: Constitutional: negative for chills, fever, night sweats, weight changes, or fatigue  HEENT: negative for vision changes, hearing loss,  congestion, rhinorrhea, ST, epistaxis, or sinus pressure Cardiovascular: negative for chest pain or palpitations Respiratory: negative for hemoptysis, wheezing, shortness of breath, or cough Abdominal: negative for abdominal pain, nausea, vomiting, diarrhea, or constipation Dermatological: negative for rash Neurologic: negative for headache, dizziness, or syncope Genitourinary: positive vaginal itching and burning All other systems reviewed and are otherwise negative with the exception to those above and in the HPI.   Physical Exam:  Very pleasant woman in NAD Blood pressure 128/72, pulse 75, temperature 98.6 F (37 C), temperature source Oral, resp. rate 18, height 5' 4"  (1.626 m), weight 194 lb (87.998 kg), last menstrual period 06/07/2014, SpO2 99 %, currently breastfeeding., Body mass index is 33.28 kg/(m^2). General: Well developed, well nourished, in no acute distress. Head: Normocephalic, atraumatic, eyes without discharge, sclera non-icteric, nares are without discharge. Bilateral auditory canals clear, TM's are without perforation, pearly grey and translucent with reflective cone of light bilaterally. Oral cavity moist, posterior pharynx without exudate, erythema, peritonsillar abscess, or post nasal drip.  Neck: Supple. No thyromegaly. Full ROM. No lymphadenopathy. Lungs: Clear bilaterally to auscultation without wheezes, rales, or rhonchi. Breathing is unlabored. Heart: RRR with S1 S2. No murmurs, rubs, or gallops appreciated. Abdomen: Soft, non-tender, non-distended with normoactive bowel sounds. No hepatomegaly. No rebound/guarding. No obvious abdominal masses. Msk:  Strength and tone normal for age. Extremities/Skin: Warm and dry. No clubbing or cyanosis. No edema. No rashes or  suspicious lesions.  Patient has a hyperpigmented proximal thigh rash extending over the vulva with sharp margination switch her peeling. Neuro: Alert and oriented X 3. Moves all extremities spontaneously.  Gait is normal. CNII-XII grossly in tact. Psych:  Responds to questions appropriately with a normal affect.  Vaginal exam shows white discharge in moderation, normal vaginal mucosa and normal cervix. Labs: Results for orders placed or performed in visit on 06/14/14  POCT urinalysis dipstick  Result Value Ref Range   Color, UA yellow    Clarity, UA clear    Glucose, UA neg    Bilirubin, UA neg    Ketones, UA neg    Spec Grav, UA 1.010    Blood, UA neg    pH, UA 6.0    Protein, UA neg    Urobilinogen, UA 0.2    Nitrite, UA neg    Leukocytes, UA Negative   POCT UA - Microscopic Only  Result Value Ref Range   WBC, Ur, HPF, POC 1-3    RBC, urine, microscopic 0-2    Bacteria, U Microscopic trace    Mucus, UA neg    Epithelial cells, urine per micros 2-4    Crystals, Ur, HPF, POC neg    Casts, Ur, LPF, POC neg    Yeast, UA neg   POCT Wet Prep with KOH  Result Value Ref Range   Trichomonas, UA Negative    Clue Cells Wet Prep HPF POC 0-2    Epithelial Wet Prep HPF POC 8-12    Yeast Wet Prep HPF POC neg    Bacteria Wet Prep HPF POC 2+    RBC Wet Prep HPF POC 0-2    WBC Wet Prep HPF POC 10-15    KOH Prep POC Negative     Results for orders placed or performed in visit on 06/14/14  POCT urinalysis dipstick  Result Value Ref Range   Color, UA yellow    Clarity, UA clear    Glucose, UA neg    Bilirubin, UA neg    Ketones, UA neg    Spec Grav, UA 1.010    Blood, UA neg    pH, UA 6.0    Protein, UA neg    Urobilinogen, UA 0.2    Nitrite, UA neg    Leukocytes, UA Negative   POCT UA - Microscopic Only  Result Value Ref Range   WBC, Ur, HPF, POC 1-3    RBC, urine, microscopic 0-2    Bacteria, U Microscopic trace    Mucus, UA neg    Epithelial cells, urine per micros 2-4    Crystals, Ur, HPF, POC neg    Casts, Ur, LPF, POC neg    Yeast, UA neg   POCT Wet Prep with KOH  Result Value Ref Range   Trichomonas, UA Negative    Clue Cells Wet Prep HPF POC 0-2    Epithelial  Wet Prep HPF POC 8-12    Yeast Wet Prep HPF POC neg    Bacteria Wet Prep HPF POC 2+    RBC Wet Prep HPF POC 0-2    WBC Wet Prep HPF POC 10-15    KOH Prep POC Negative      ASSESSMENT AND PLAN:  38 y.o. year old female with vaginitis, tinea cruris, and hypertension. This chart was scribed in my presence and reviewed by me personally.    ICD-9-CM ICD-10-CM   1. Vaginal discharge 623.5 N89.8 POCT urinalysis dipstick     POCT  UA - Microscopic Only     POCT Wet Prep with KOH     fluconazole (DIFLUCAN) 150 MG tablet  2. Essential hypertension 401.9 I10 labetalol (NORMODYNE) 100 MG tablet  3. Tinea cruris 110.3 B35.6 fluconazole (DIFLUCAN) 150 MG tablet   Vaginal discharge - Plan: POCT urinalysis dipstick, POCT UA - Microscopic Only, POCT Wet Prep with KOH, fluconazole (DIFLUCAN) 150 MG tablet  Essential hypertension - Plan: labetalol (NORMODYNE) 100 MG tablet  Tinea cruris - Plan: fluconazole (DIFLUCAN) 150 MG tablet     Signed, Kristen Haber, MD 06/14/2014 5:49 PM

## 2015-06-25 ENCOUNTER — Ambulatory Visit (INDEPENDENT_AMBULATORY_CARE_PROVIDER_SITE_OTHER): Payer: Self-pay | Admitting: Emergency Medicine

## 2015-06-25 VITALS — BP 122/86 | HR 71 | Temp 98.3°F | Resp 16 | Ht 64.0 in | Wt 209.0 lb

## 2015-06-25 DIAGNOSIS — I10 Essential (primary) hypertension: Secondary | ICD-10-CM

## 2015-06-25 LAB — COMPREHENSIVE METABOLIC PANEL
ALT: 19 U/L (ref 6–29)
AST: 17 U/L (ref 10–30)
Albumin: 4.5 g/dL (ref 3.6–5.1)
Alkaline Phosphatase: 62 U/L (ref 33–115)
BUN: 9 mg/dL (ref 7–25)
CO2: 24 mmol/L (ref 20–31)
Calcium: 9.1 mg/dL (ref 8.6–10.2)
Chloride: 104 mmol/L (ref 98–110)
Creat: 0.46 mg/dL — ABNORMAL LOW (ref 0.50–1.10)
Glucose, Bld: 83 mg/dL (ref 65–99)
POTASSIUM: 4.2 mmol/L (ref 3.5–5.3)
Sodium: 137 mmol/L (ref 135–146)
Total Bilirubin: 0.9 mg/dL (ref 0.2–1.2)
Total Protein: 7.5 g/dL (ref 6.1–8.1)

## 2015-06-25 LAB — CBC
HEMATOCRIT: 40.1 % (ref 36.0–46.0)
Hemoglobin: 13.5 g/dL (ref 12.0–15.0)
MCH: 29.4 pg (ref 26.0–34.0)
MCHC: 33.7 g/dL (ref 30.0–36.0)
MCV: 87.4 fL (ref 78.0–100.0)
MPV: 11.4 fL (ref 8.6–12.4)
Platelets: 277 10*3/uL (ref 150–400)
RBC: 4.59 MIL/uL (ref 3.87–5.11)
RDW: 14.1 % (ref 11.5–15.5)
WBC: 9.4 10*3/uL (ref 4.0–10.5)

## 2015-06-25 LAB — LIPID PANEL
CHOL/HDL RATIO: 3.2 ratio (ref <=5.0)
Cholesterol: 146 mg/dL (ref 125–200)
HDL: 45 mg/dL — AB (ref >=46)
LDL CALC: 81 mg/dL (ref <130)
Triglycerides: 99 mg/dL (ref <150)
VLDL: 20 mg/dL (ref <30)

## 2015-06-25 MED ORDER — LABETALOL HCL 100 MG PO TABS: 100.0000 mg | ORAL_TABLET | Freq: Two times a day (BID) | ORAL | Status: DC

## 2015-06-25 NOTE — Patient Instructions (Signed)
Hipertensin (Hypertension) La hipertensin, conocida comnmente como presin arterial alta, se produce cuando la sangre bombea en las arterias con mucha fuerza. Las arterias son los vasos sanguneos que transportan la sangre desde el corazn hacia todas las partes del cuerpo. Una lectura de la presin arterial consiste en un nmero ms alto sobre un nmero ms bajo, por ejemplo, 110/72. El nmero ms alto (presin sistlica) corresponde a la presin interna de las arterias cuando el corazn [place]. El nmero ms bajo (presin diastlica) corresponde a la presin interna de las arterias cuando el corazn se relaja. En condiciones ideales, la presin arterial debe ser inferior a 120/80. La hipertensin fuerza al corazn a trabajar ms para Herbalist. Las arterias pueden estrecharse o ponerse rgidas. La hipertensin no tratada o no controlada puede causar infarto de miocardio, ictus, enfermedad renal y otros problemas. Monona de riesgo de hipertensin son controlables, pero otros no lo son.  Entre los factores de riesgo que usted no puede Chief Technology Officer, se [employer] siguientes:   La raza. El riesgo es mayor para las Retail banker.  La edad. Los riesgos aumentan con la edad.  El sexo. Antes de los 45aos, los hombres corren ms [employer]. Despus de los 65aos, las mujeres corren ms [employer]. Entre los factores de riesgo que usted puede Chief Technology Officer, se [employer] siguientes:  No hacer la cantidad suficiente de actividad fsica o ejercicio.  Tener sobrepeso.  Consumir mucha grasa, azcar, caloras o sal en la dieta.  Beber alcohol en exceso. SIGNOS Y SNTOMAS Por lo general, la hipertensin no causa signos o sntomas. La hipertensin arterial demasiado alta (crisis hipertensiva) puede causar dolor de cabeza, ansiedad, falta de aire y hemorragia nasal. DIAGNSTICO Para detectar si usted tiene hipertensin, el  mdico le medir la presin arterial mientras est sentado, con el brazo levantado a la altura del corazn. Debe medirla al [facility] veces en el mismo brazo. Determinadas condiciones pueden causar una diferencia de presin arterial entre el brazo izquierdo y Insurance underwriter. El hecho de tener una sola lectura de la presin arterial ms alta que lo normal no significa que Stage manager. Si no est claro si tiene hipertensin arterial, es posible que se le pida que regrese otro da para volver a controlarle la presin arterial. O bien se le puede pedir que se controle la presin arterial en su casa durante 1 o ms meses. [place] hipertensin arterial incluye hacer cambios en el estilo de vida y, posiblemente, tomar medicamentos. Un estilo de vida saludable puede ayudar a bajar la presin arterial alta. Quiz deba cambiar algunos hbitos. Los cambios en el estilo de vida pueden incluir lo siguiente:  Seguir la dieta DASH. Esta dieta tiene un alto contenido de frutas, verduras y Psychologist, prison and probation services. Incluye poca cantidad de sal, carnes rojas y azcares agregados.  Mantenga el consumo de sodio por debajo de 2300 mg por da.  Realizar al [employer] 30 y 52 minutos de ejercicio [place], 4 veces por semana como mnimo.  Perder peso, si es necesario.  No fumar.  Limitar el consumo de bebidas alcohlicas.  Aprender formas de reducir el estrs. El mdico puede recetarle medicamentos si los cambios en el estilo de vida no son suficientes para Child psychotherapist la presin arterial y si una de las siguientes afirmaciones es verdadera:  Fidel Levy 5 y 49 aos y su presin arterial sistlica est por encima de 140.  Tiene  60 aos o ms y su presin arterial sistlica est por encima de 150.  Su presin arterial diastlica est por encima de 90.  Tiene diabetes y su presin arterial sistlica est por encima de 140 o su presin arterial diastlica est por encima de  90.  Tiene una enfermedad renal y su presin arterial est por encima de 140/90.  Tiene una enfermedad cardaca y su presin arterial est por encima de 140/90. La presin arterial deseada puede variar en funcin de las enfermedades, la edad y otros factores personales. INSTRUCCIONES PARA EL CUIDADO EN EL HOGAR  Haga que le midan de nuevo la presin arterial segn las indicaciones del [place] los medicamentos solamente como se lo haya indicado el mdico. Siga cuidadosamente las indicaciones. Los medicamentos para la presin arterial deben tomarse segn las indicaciones. Los medicamentos pierden eficacia al omitir las dosis. El hecho de omitir las dosis tambin [place] el riesgo de otros problemas.  No fume.  Contrlese la presin arterial en su casa segn las indicaciones del mdico. SOLICITE ATENCIN MDICA SI:   Piensa que tiene una reaccin alrgica a los medicamentos.  Tiene mareos o dolores de cabeza con Scientist, research (physical sciences).  Tiene hinchazn en los tobillos.  Tiene problemas de visin. SOLICITE ATENCIN MDICA DE INMEDIATO SI:  Siente un dolor de cabeza intenso o confusin.  Siente debilidad inusual, adormecimiento o que Geneticist, molecular.  Siente dolor intenso en el pecho o en el abdomen.  Vomita repetidas veces.  Tiene dificultad para respirar. ASEGRESE DE QUE:   Comprende estas instrucciones.  Controlar su afeccin.  Recibir ayuda de inmediato si no mejora o si empeora.   Esta informacin no tiene Marine scientist el consejo del mdico. Asegrese de hacerle al mdico cualquier pregunta que tenga.   Document Released: 06/23/2005 Document Revised: 11/07/2014 Elsevier Interactive Patient Education [employer].

## 2015-06-25 NOTE — Progress Notes (Signed)
Subjective:  Patient ID: Kristen Madden, female    DOB: 1976/06/24  Age: 39 y.o. MRN: [id number]  CC: Medication Refill   HPI Kristen Madden presents  in a history of hypertension is run out of her medication today. She has no acute complaints has no chronic past medical history other than hypertension. She's nonsmoker has no diabetes or high cholesterol. She's not had any lab work performed over a year  History Kristen Madden has a past medical history of Hypertension and Ovarian cyst.   She has past surgical history that includes No past surgeries and Cholecystectomy (N/A, 05/16/2014).   Her  family history includes Hypertension in her mother.  She   reports that she has never smoked. She has never used smokeless tobacco. She reports that she does not drink alcohol or use illicit drugs.  Outpatient Prescriptions Prior to Visit  Medication Sig Dispense Refill  . Prenatal Vit-Fe Fumarate-FA (PRENATAL MULTIVITAMIN) TABS tablet Take 1 tablet by mouth daily at 12 noon.    . labetalol (NORMODYNE) 100 MG tablet Take 1 tablet (100 mg total) by mouth 2 (two) times daily. 180 tablet 3  . fluconazole (DIFLUCAN) 150 MG tablet Take 1 tablet (150 mg total) by mouth once. (Patient not taking: Reported on 06/25/2015) 1 tablet 0  . metroNIDAZOLE (FLAGYL) 500 MG tablet Take 1 tablet (500 mg total) by mouth 2 (two) times daily. (Patient not taking: Reported on 06/25/2015) 14 tablet 0   No facility-administered medications prior to visit.    Social History   Social History  . Marital Status: Married    Spouse Name: N/A  . Number of Children: N/A  . Years of Education: N/A   Social History Main Topics  . Smoking status: Never Smoker   . Smokeless tobacco: Never Used  . Alcohol Use: No  . Drug Use: No  . Sexual Activity: Yes    Birth Control/ Protection: Condom   Other Topics Concern  . None   Social History Narrative     Review of Systems  Constitutional: Negative for  fever, chills and appetite change.  HENT: Negative for congestion, ear pain, postnasal drip, sinus pressure and sore throat.   Eyes: Negative for pain and redness.  Respiratory: Negative for cough, shortness of breath and wheezing.   Cardiovascular: Negative for leg swelling.  Gastrointestinal: Negative for nausea, vomiting, abdominal pain, diarrhea, constipation and blood in stool.  Endocrine: Negative for polyuria.  Genitourinary: Negative for dysuria, urgency, frequency and flank pain.  Musculoskeletal: Negative for gait problem.  Skin: Negative for rash.  Neurological: Negative for weakness and headaches.  Psychiatric/Behavioral: Negative for confusion and decreased concentration. The patient is not nervous/anxious.     Objective:  BP 122/86 mmHg  Pulse 71  Temp(Src) 98.3 F (36.8 C)  Resp 16  Ht 5' 4"  (1.626 m)  Wt 209 lb (94.802 kg)  BMI 35.86 kg/m2  SpO2 99%  LMP 06/22/2015  Physical Exam  Constitutional: She is oriented to person, place, and time. She appears well-developed and well-nourished.  HENT:  Head: Normocephalic and atraumatic.  Eyes: Conjunctivae are normal. Pupils are equal, round, and reactive to light.  Pulmonary/Chest: Effort normal.  Musculoskeletal: She exhibits no edema.  Neurological: She is alert and oriented to person, place, and time.  Skin: Skin is dry.  Psychiatric: She has a normal mood and affect. Her behavior is normal. Thought content normal.      Assessment & Plan:   [name] was seen today for medication  refill.  Diagnoses and all orders for this visit:  Essential hypertension -     labetalol (NORMODYNE) 100 MG tablet; Take 1 tablet (100 mg total) by mouth 2 (two) times daily. -     Comprehensive metabolic panel -     Lipid panel -     CBC   I am having Ms. Kristen Madden maintain her prenatal multivitamin, fluconazole, metroNIDAZOLE, and labetalol.  Meds ordered this encounter  Medications  . labetalol (NORMODYNE) 100 MG  tablet    Sig: Take 1 tablet (100 mg total) by mouth 2 (two) times daily.    Dispense:  180 tablet    Refill:  3    Appropriate red flag conditions were discussed with the patient as well as actions that should be taken.  Patient expressed his understanding.  Follow-up: Return in about 1 year (around 06/24/2016).  Roselee Culver, MD

## 2016-03-28 LAB — HM PAP SMEAR

## 2016-06-23 ENCOUNTER — Ambulatory Visit (INDEPENDENT_AMBULATORY_CARE_PROVIDER_SITE_OTHER): Payer: Self-pay | Admitting: Physician Assistant

## 2016-06-23 VITALS — BP 132/86 | HR 69 | Temp 98.9°F | Resp 16 | Ht 64.0 in | Wt 217.0 lb

## 2016-06-23 DIAGNOSIS — I10 Essential (primary) hypertension: Secondary | ICD-10-CM

## 2016-06-23 MED ORDER — LABETALOL HCL 100 MG PO TABS: 100.0000 mg | ORAL_TABLET | Freq: Two times a day (BID) | ORAL | 3 refills | Status: DC

## 2016-06-23 NOTE — Patient Instructions (Addendum)
I will have your lab results within 2 weeks. Please continue to try to exercise 4 times per week for 30 minutes of aerobic activity.    IF you received an x-ray today, you will receive an invoice from [facility]. Please contact [facility] at [phone] with questions or concerns regarding your invoice.   IF you received labwork today, you will receive an invoice from [facility]. Please contact [facility] at [phone] with questions or concerns regarding your invoice.   Our billing staff will not be able to assist you with questions regarding bills from these companies.  You will be contacted with the lab results as soon as they are available. The fastest way to get your results is to activate your My Chart account. Instructions are located on the last page of this paperwork. If you have not heard from [place] regarding the results in 2 weeks, please contact this office.

## 2016-06-23 NOTE — Progress Notes (Signed)
[facility] [address] [place] [address] [phone]  Date:  06/23/2016   Name:  Kristen Madden   DOB:  February 05, 1976   MRN:  [id number]  PCP:  [name], MD    History of Present Illness:  Kristen Madden is a 40 y.o. female patient who presents to [facility] for cc of high blood pressure.     No chest pains, sob, or dyspnea, or leg swelling.   She has ran out for today.  No missed doses.    attempts to keep a no sodium diet/or added. Recently started exercising again.  Hypertension ROS: taking medications as instructed, no medication side effects noted, no TIA's, no chest pain on exertion, no dyspnea on exertion and no swelling of ankles.  New concerns: no concerns at this time.    Current Outpatient Prescriptions on File Prior to Visit  Medication Sig Dispense Refill  . labetalol (NORMODYNE) 100 MG tablet Take 1 tablet (100 mg total) by mouth 2 (two) times daily. 180 tablet 3  . Prenatal Vit-Fe Fumarate-FA (PRENATAL MULTIVITAMIN) TABS tablet Take 1 tablet by mouth daily at 12 noon.     No current facility-administered medications on file prior to visit.     ROS ROS otherwise unremarkable unless listed above.    Physical Exam  Constitutional: She is oriented to person, place, and time. She appears well-developed and well-nourished. No distress.  HENT:  Head: Normocephalic and atraumatic.  Right Ear: External ear normal.  Left Ear: External ear normal.  Eyes: Conjunctivae and EOM are normal. Pupils are equal, round, and reactive to light.  Cardiovascular: Normal rate, regular rhythm and normal heart sounds.  Exam reveals no gallop, no distant heart sounds and no friction rub.   No murmur heard. Pulses:      Carotid pulses are 2+ on the right side, and 2+ on the left side.      Radial pulses are 2+ on the right side, and 2+ on the left side.       Dorsalis pedis pulses are 2+ on the right side, and 2+ on the left side.   Pulmonary/Chest: Effort normal. No respiratory distress.  Neurological: She is alert and oriented to person, place, and time.  Skin: She is not diaphoretic.  Psychiatric: She has a normal mood and affect. Her behavior is normal.     Assessment:   Hypertension Stable, will refill for the year.  Labs drawn.  Non-fasting, however patient would like them performed today.  Oatmeal with water only this morning. Essential hypertension - Plan: CMP14+EGFR, Lipid panel, labetalol (NORMODYNE) 100 MG tablet  Ivar Drape, PA-C [facility] 12/18/20178:51 PM

## 2016-06-24 LAB — LIPID PANEL
CHOL/HDL RATIO: 3.3 ratio (ref 0.0–4.4)
Cholesterol, Total: 166 mg/dL (ref 100–199)
HDL: 51 mg/dL (ref >39)
LDL CALC: 93 mg/dL (ref 0–99)
TRIGLYCERIDES: 109 mg/dL (ref 0–149)
VLDL Cholesterol Cal: 22 mg/dL (ref 5–40)

## 2016-06-24 LAB — CMP14+EGFR
A/G RATIO: 1.8 (ref 1.2–2.2)
ALT: 27 IU/L (ref 0–32)
AST: 25 IU/L (ref 0–40)
Albumin: 4.8 g/dL (ref 3.5–5.5)
Alkaline Phosphatase: 75 IU/L (ref 39–117)
BUN/Creatinine Ratio: 15 (ref 9–23)
BUN: 10 mg/dL (ref 6–24)
Bilirubin Total: 0.9 mg/dL (ref 0.0–1.2)
CALCIUM: 9.8 mg/dL (ref 8.7–10.2)
CO2: 21 mmol/L (ref 18–29)
CREATININE: 0.66 mg/dL (ref 0.57–1.00)
Chloride: 101 mmol/L (ref 96–106)
GFR, EST AFRICAN AMERICAN: 128 mL/min/{1.73_m2} (ref >59)
GFR, EST NON AFRICAN AMERICAN: 111 mL/min/{1.73_m2} (ref >59)
GLOBULIN, TOTAL: 2.7 g/dL (ref 1.5–4.5)
Glucose: 105 mg/dL — ABNORMAL HIGH (ref 65–99)
POTASSIUM: 4.6 mmol/L (ref 3.5–5.2)
Sodium: 140 mmol/L (ref 134–144)
TOTAL PROTEIN: 7.5 g/dL (ref 6.0–8.5)

## 2016-08-04 ENCOUNTER — Encounter: Payer: Self-pay | Admitting: Physician Assistant

## 2017-06-12 ENCOUNTER — Encounter: Payer: Self-pay | Admitting: Physician Assistant

## 2017-06-12 ENCOUNTER — Ambulatory Visit: Payer: Self-pay | Admitting: Physician Assistant

## 2017-06-12 ENCOUNTER — Ambulatory Visit: Payer: Self-pay | Admitting: Family Medicine

## 2017-06-12 ENCOUNTER — Other Ambulatory Visit: Payer: Self-pay

## 2017-06-12 VITALS — BP 116/80 | HR 72 | Temp 98.1°F | Resp 16 | Ht 64.0 in | Wt 220.8 lb

## 2017-06-12 DIAGNOSIS — Z79899 Other long term (current) drug therapy: Secondary | ICD-10-CM

## 2017-06-12 DIAGNOSIS — I10 Essential (primary) hypertension: Secondary | ICD-10-CM

## 2017-06-12 LAB — POCT URINALYSIS DIP (MANUAL ENTRY)
Bilirubin, UA: NEGATIVE
Blood, UA: NEGATIVE
Glucose, UA: NEGATIVE mg/dL
Ketones, POC UA: NEGATIVE mg/dL
Nitrite, UA: NEGATIVE
Protein Ur, POC: NEGATIVE mg/dL
Spec Grav, UA: 1.02 (ref 1.010–1.025)
Urobilinogen, UA: 0.2 E.U./dL
pH, UA: 7 (ref 5.0–8.0)

## 2017-06-12 MED ORDER — LABETALOL HCL 100 MG PO TABS: 100.0000 mg | ORAL_TABLET | Freq: Two times a day (BID) | ORAL | 3 refills | Status: DC

## 2017-06-12 NOTE — Patient Instructions (Addendum)
See below for DASH diet and routine health maintenance information. Come back in 1 year for recheck.  Thank you for coming in today. I hope you feel we met your needs.  Feel free to call PCP if you have any questions or further requests.  Please consider signing up for MyChart if you do not already have it, as this is a great way to communicate with me.  Best,  Whitney McVey, PA-C    Plan de alimentacin DASH (DASH Eating Plan) DASH es la sigla en ingls de "Enfoques Alimentarios para Detener la Hipertensin". El plan de alimentacin DASH ha demostrado bajar la presin arterial elevada (hipertensin). Los beneficios adicionales para la salud pueden incluir la disminucin del riesgo de diabetes mellitus tipo2, enfermedades cardacas e ictus. Este plan tambin puede ayudar a Horticulturist, commercial. QU DEBO SABER ACERCA DEL PLAN DE ALIMENTACIN DASH? Para el plan de alimentacin DASH, seguir las siguientes pautas generales:  Elija los alimentos que contienen menos de 150 miligramos de sodio por porcin (segn se indica en la etiqueta de los alimentos).  Use hierbas o aderezos sin sal, en lugar de sal de mesa o sal marina.  Consulte al mdico o farmacutico antes de usar sustitutos de la sal.  Consuma los productos con menor contenido de sodio. Estos productos suelen estar etiquetados como "bajo en sodio" o "sin agregado de sal".  Coma alimentos frescos. No consuma una gran cantidad de alimentos enlatados.  Coma ms verduras, frutas y productos lcteos con bajo contenido de [place].  Elija los cereales integrales. Busque la palabra "integral" en Equities trader de la lista de ingredientes.  Elija el pescado y el pollo o el pavo sin piel ms a menudo que las carnes rojas. Limite el consumo de pescado, carne de ave y carne a 6onzas (170g) por Training and development officer.  Limite el consumo de dulces, postres, azcares y bebidas azucaradas.  Elija las grasas saludables para el corazn.  Consuma ms comida casera y  menos de restaurante, de buf y comida rpida.  Limite el consumo de alimentos fritos.  No fra los alimentos. A la hora de cocinarlos, opte por hornearlos, hervirlos, grillarlos y asarlos a Administrator, arts.  Cuando coma en un restaurante, pida que preparen su comida con menos sal o, en lo posible, sin nada de sal. QU ALIMENTOS PUEDO COMER? Pida ayuda a un nutricionista para conocer las necesidades calricas individuales. Cereales Pan de salvado o integral. Arroz integral. Pastas de salvado o integrales. Quinua, trigo burgol y cereales integrales. Cereales con bajo contenido de sodio. Tortillas de harina de maz o de salvado. Pan de maz integral. Galletas saladas integrales. Galletas con bajo contenido de [place]. Vegetales Verduras frescas o congeladas (crudas, al vapor, asadas o grilladas). Jugos de tomate y verduras con contenido bajo o reducido de sodio. Pasta y salsa de tomate con contenido bajo o [place]. Verduras enlatadas con bajo contenido de sodio o reducido de sodio. Lambert Mody Lambert Mody frescas, en conserva (en su jugo natural) o frutas congeladas. Carnes y otros productos con protenas Carne de res molida (al 85% o ms [place]), carne de res de animales alimentados con pastos o carne de res sin la grasa. Pollo o pavo sin piel. Carne de pollo o de [place]. Cerdo sin la grasa. Todos los pescados y frutos de mar. Huevos. Porotos, guisantes o lentejas secos. Frutos secos y semillas sin sal. Frijoles enlatados sin sal. Lcteos Productos lcteos con bajo contenido de grasas, como leche descremada o al 1%, quesos reducidos en [place]  o al 2%, ricota con bajo contenido de grasas o queso cottage, o yogur natural con bajo contenido de grasas. Quesos con contenido bajo o reducido de sodio. Grasas y aceites Margarinas en barra que no contengan grasas trans. [place] y alios para ensaladas livianos o reducidos en grasas (reducidos en sodio). [place]. Aceites de crtamo, oliva o canola.  Mantequilla natural de man o almendra. Otros Palomitas de maz y pretzels sin sal. Los artculos mencionados arriba pueden no ser una lista completa de las bebidas o los alimentos recomendados. Comunquese con el nutricionista para conocer ms opciones. QU ALIMENTOS NO SE RECOMIENDAN? Cereales Pan blanco. Pastas blancas. Arroz blanco. Pan de maz refinado. Bagels y croissants. Galletas saladas que contengan grasas trans. Vegetales Vegetales con crema o fritos. Verduras en salsa de queso. Verduras enlatadas comunes. Pasta y salsa de tomate en lata comunes. Jugos comunes de tomate y de verduras. Frutas Fruta enlatada en almbar liviano o espeso. Jugo de frutas. Carnes y otros productos con protenas [place] de carne con grasa. Costillas, alas de pollo, tocineta, salchicha, mortadela, salame, chinchulines, tocino, perros calientes, salchichas alemanas y embutidos envasados. Frutos secos y semillas con sal. Frijoles con sal en lata. Lcteos Leche entera o al 2%, crema, mezcla de leche y crema, y queso crema. Yogur entero o endulzado. Quesos o queso azul con alto contenido de grasas. Cremas no lcteas y coberturas batidas. Quesos procesados, quesos para untar o cuajadas. Condimentos Sal de cebolla y ajo, sal condimentada, sal de mesa y sal marina. Salsas en lata y envasadas. Salsa Worcestershire. Salsa trtara. Salsa barbacoa. Salsa teriyaki. Salsa de soja, incluso la que tiene contenido reducido de sodio. Salsa de carne. Salsa de pescado. Salsa de ostras. Salsa rosada. Rbano picante. Ketchup y mostaza. Saborizantes y tiernizantes para carne. Caldo en cubitos. Salsa picante. Salsa tabasco. Adobos. Aderezos para tacos. Salsas. Grasas y aceites Mantequilla, margarina en barra, manteca de cerdo, grasa, mantequilla clarificada y grasa de tocino. Aceites de coco, de palmiste o de palma. Aderezos comunes para ensalada. Otros Pickles y aceitunas. Palomitas de maz y pretzels con sal. Los artculos  mencionados arriba pueden no ser una lista completa de las bebidas y los alimentos que se deben evitar. Comunquese con el nutricionista para obtener ms informacin. DNDE PUEDO ENCONTRAR MS INFORMACIN? Instituto Nacional del Corazn, del Pulmn y de la Sangre (National Heart, Lung, and Blood Institute): www.nhlbi.nih.gov/health/health-topics/topics/dash/ Esta informacin no tiene como fin reemplazar el consejo del mdico. Asegrese de hacerle al mdico cualquier pregunta que tenga. Document Released: 06/12/2011 Document Revised: 10/15/2015 Document Reviewed: 04/27/2013 Elsevier Interactive Patient Education  2017 Elsevier Inc.  Health Maintenance, Female Adopting a healthy lifestyle and getting preventive care can go a long way to promote health and wellness. Talk with your health care provider about what schedule of regular examinations is right for you. This is a good chance for you to check in with your provider about disease prevention and staying healthy. In between checkups, there are plenty of things you can do on your own. Experts have done a lot of research about which lifestyle changes and preventive measures are most likely to keep you healthy. Ask your health care provider for more information. Weight and diet Eat a healthy diet  Be sure to include plenty of vegetables, fruits, low-fat dairy products, and lean protein.  Do not eat a lot of foods high in solid fats, added sugars, or salt.  Get regular exercise. This is one of the most important things you can do for your health. ?   Most adults should exercise for at least 150 minutes each week. The exercise should increase your heart rate and make you sweat (moderate-intensity exercise). ? Most adults should also do strengthening exercises at least twice a week. This is in addition to the moderate-intensity exercise.  Maintain a healthy weight  Body mass index (BMI) is a measurement that can be used to identify possible weight  problems. It estimates body fat based on height and weight. Your health care provider can help determine your BMI and help you achieve or maintain a healthy weight.  For females 54 years of age and older: ? A BMI below 18.5 is considered underweight. ? A BMI of 18.5 to 24.9 is normal. ? A BMI of 25 to 29.9 is considered overweight. ? A BMI of 30 and above is considered obese.  Watch levels of cholesterol and blood lipids  You should start having your blood tested for lipids and cholesterol at 41 years of age, then have this test every 5 years.  You may need to have your cholesterol levels checked more often if: ? Your lipid or cholesterol levels are high. ? You are older than 41 years of age. ? You are at high risk for heart disease.  Cancer screening Lung Cancer  Lung cancer screening is recommended for adults 56-27 years old who are at high risk for lung cancer because of a history of smoking.  A yearly low-dose CT scan of the lungs is recommended for people who: ? Currently smoke. ? Have quit within the past 15 years. ? Have at least a 30-pack-year history of smoking. A pack year is smoking an average of one pack of cigarettes a day for 1 year.  Yearly screening should continue until it has been 15 years since you quit.  Yearly screening should stop if you develop a health problem that would prevent you from having lung cancer treatment.  Breast Cancer  Practice breast self-awareness. This means understanding how your breasts normally appear and feel.  It also means doing regular breast self-exams. Let your health care provider know about any changes, no matter how small.  If you are in your 20s or 30s, you should have a clinical breast exam (CBE) by a health care provider every 1-3 years as part of a regular health exam.  If you are 56 or older, have a CBE every year. Also consider having a breast X-ray (mammogram) every year.  If you have a family history of breast  cancer, talk to your health care provider about genetic screening.  If you are at high risk for breast cancer, talk to your health care provider about having an MRI and a mammogram every year.  Breast cancer gene (BRCA) assessment is recommended for women who have family members with BRCA-related cancers. BRCA-related cancers include: ? Breast. ? Ovarian. ? Tubal. ? Peritoneal cancers.  Results of the assessment will determine the need for genetic counseling and BRCA1 and BRCA2 testing.  Cervical Cancer Your health care provider may recommend that you be screened regularly for cancer of the pelvic organs (ovaries, uterus, and vagina). This screening involves a pelvic examination, including checking for microscopic changes to the surface of your cervix (Pap test). You may be encouraged to have this screening done every 3 years, beginning at age 22.  For women ages 9-65, health care providers may recommend pelvic exams and Pap testing every 3 years, or they may recommend the Pap and pelvic exam, combined with testing for human  papilloma virus (HPV), every 5 years. Some types of HPV increase your risk of cervical cancer. Testing for HPV may also be done on women of any age with unclear Pap test results.  Other health care providers may not recommend any screening for nonpregnant women who are considered low risk for pelvic cancer and who do not have symptoms. Ask your health care provider if a screening pelvic exam is right for you.  If you have had past treatment for cervical cancer or a condition that could lead to cancer, you need Pap tests and screening for cancer for at least 20 years after your treatment. If Pap tests have been discontinued, your risk factors (such as having a new sexual partner) need to be reassessed to determine if screening should resume. Some women have medical problems that increase the chance of getting cervical cancer. In these cases, your health care provider may  recommend more frequent screening and Pap tests.  Colorectal Cancer  This type of cancer can be detected and often prevented.  Routine colorectal cancer screening usually begins at 41 years of age and continues through 41 years of age.  Your health care provider may recommend screening at an earlier age if you have risk factors for colon cancer.  Your health care provider may also recommend using home test kits to check for hidden blood in the stool.  A small camera at the end of a tube can be used to examine your colon directly (sigmoidoscopy or colonoscopy). This is done to check for the earliest forms of colorectal cancer.  Routine screening usually begins at age 50.  Direct examination of the colon should be repeated every 5-10 years through 41 years of age. However, you may need to be screened more often if early forms of precancerous polyps or small growths are found.  Skin Cancer  Check your skin from head to toe regularly.  Tell your health care provider about any new moles or changes in moles, especially if there is a change in a mole's shape or color.  Also tell your health care provider if you have a mole that is larger than the size of a pencil eraser.  Always use sunscreen. Apply sunscreen liberally and repeatedly throughout the day.  Protect yourself by wearing long sleeves, pants, a wide-brimmed hat, and sunglasses whenever you are outside.  Heart disease, diabetes, and high blood pressure  High blood pressure causes heart disease and increases the risk of stroke. High blood pressure is more likely to develop in: ? People who have blood pressure in the high end of the normal range (130-139/85-89 mm Hg). ? People who are overweight or obese. ? People who are African American.  If you are 18-39 years of age, have your blood pressure checked every 3-5 years. If you are 40 years of age or older, have your blood pressure checked every year. You should have your blood  pressure measured twice-once when you are at a hospital or clinic, and once when you are not at a hospital or clinic. Record the average of the two measurements. To check your blood pressure when you are not at a hospital or clinic, you can use: ? An automated blood pressure machine at a pharmacy. ? A home blood pressure monitor.  If you are between 55 years and 79 years old, ask your health care provider if you should take aspirin to prevent strokes.  Have regular diabetes screenings. This involves taking a blood sample to check your   fasting blood sugar level. ? If you are at a normal weight and have a low risk for diabetes, have this test once every three years after 41 years of age. ? If you are overweight and have a high risk for diabetes, consider being tested at a younger age or more often. Preventing infection Hepatitis B  If you have a higher risk for hepatitis B, you should be screened for this virus. You are considered at high risk for hepatitis B if: ? You were born in a country where hepatitis B is common. Ask your health care provider which countries are considered high risk. ? Your parents were born in a high-risk country, and you have not been immunized against hepatitis B (hepatitis B vaccine). ? You have HIV or AIDS. ? You use needles to inject street drugs. ? You live with someone who has hepatitis B. ? You have had sex with someone who has hepatitis B. ? You get hemodialysis treatment. ? You take certain medicines for conditions, including cancer, organ transplantation, and autoimmune conditions.  Hepatitis C  Blood testing is recommended for: ? Everyone born from 1945 through 1965. ? Anyone with known risk factors for hepatitis C.  Sexually transmitted infections (STIs)  You should be screened for sexually transmitted infections (STIs) including gonorrhea and chlamydia if: ? You are sexually active and are younger than 41 years of age. ? You are older than 41 years  of age and your health care provider tells you that you are at risk for this type of infection. ? Your sexual activity has changed since you were last screened and you are at an increased risk for chlamydia or gonorrhea. Ask your health care provider if you are at risk.  If you do not have HIV, but are at risk, it may be recommended that you take a prescription medicine daily to prevent HIV infection. This is called pre-exposure prophylaxis (PrEP). You are considered at risk if: ? You are sexually active and do not regularly use condoms or know the HIV status of your partner(s). ? You take drugs by injection. ? You are sexually active with a partner who has HIV.  Talk with your health care provider about whether you are at high risk of being infected with HIV. If you choose to begin PrEP, you should first be tested for HIV. You should then be tested every 3 months for as long as you are taking PrEP. Pregnancy  If you are premenopausal and you may become pregnant, ask your health care provider about preconception counseling.  If you may become pregnant, take 400 to 800 micrograms (mcg) of folic acid every day.  If you want to prevent pregnancy, talk to your health care provider about birth control (contraception). Osteoporosis and menopause  Osteoporosis is a disease in which the bones lose minerals and strength with aging. This can result in serious bone fractures. Your risk for osteoporosis can be identified using a bone density scan.  If you are 65 years of age or older, or if you are at risk for osteoporosis and fractures, ask your health care provider if you should be screened.  Ask your health care provider whether you should take a calcium or vitamin D supplement to lower your risk for osteoporosis.  Menopause may have certain physical symptoms and risks.  Hormone replacement therapy may reduce some of these symptoms and risks. Talk to your health care provider about whether hormone  replacement therapy is right for you. Follow   these instructions at home:  Schedule regular health, dental, and eye exams.  Stay current with your immunizations.  Do not use any tobacco products including cigarettes, chewing tobacco, or electronic cigarettes.  If you are pregnant, do not drink alcohol.  If you are breastfeeding, limit how much and how often you drink alcohol.  Limit alcohol intake to no more than 1 drink per day for nonpregnant women. One drink equals 12 ounces of beer, 5 ounces of wine, or 1 ounces of hard liquor.  Do not use street drugs.  Do not share needles.  Ask your health care provider for help if you need support or information about quitting drugs.  Tell your health care provider if you often feel depressed.  Tell your health care provider if you have ever been abused or do not feel safe at home. This information is not intended to replace advice given to you by your health care provider. Make sure you discuss any questions you have with your health care provider. Document Released: 01/06/2011 Document Revised: 11/29/2015 Document Reviewed: 03/27/2015 Elsevier Interactive Patient Education  2018 Elsevier Inc.  IF you received an x-ray today, you will receive an invoice from [facility]. Please contact [facility] at [phone] with questions or concerns regarding your invoice.   IF you received labwork today, you will receive an invoice from [facility]. Please contact [facility] at [phone] with questions or concerns regarding your invoice.   Our billing staff will not be able to assist you with questions regarding bills from these companies.  You will be contacted with the lab results as soon as they are available. The fastest way to get your results is to activate your My Chart account. Instructions are located on the last page of this paperwork. If you have not heard from us regarding the results in 2 weeks, please contact this  office.     

## 2017-06-12 NOTE — Progress Notes (Signed)
Kristen Madden  MRN: [id number] DOB: 06/22/1976  PCP: Orma Flaming, MD  Subjective:  Pt is a pleasant 41 year old female PMH who presents to clinic for blood pressure check.   HTN x > 10 years - controlled with labetalol 100 mg bid. Last dose last night. Today's blood pressure is 116/80. She does not check home blood pressures. Last OV for this problem 06/2016. She is feeling very well today. Denies medication side effects.  She walks about 40 minutes twice/week. Tries to eat healthy, low-salt diet.  Denies chest pain, shob, LES swelling, neck or arm pain.  She does not smoke.  She is fasting today.   Review of Systems  Constitutional: Negative for diaphoresis and fatigue.  Respiratory: Negative for cough, chest tightness and shortness of breath.   Cardiovascular: Negative for chest pain, palpitations and leg swelling.  Gastrointestinal: Negative for nausea and vomiting.  Neurological: Negative for light-headedness and headaches.    Patient Active Problem List   Diagnosis Date Noted  . Gallstone pancreatitis 05/14/2014  . HTN (hypertension) 06/28/2013    Current Outpatient Medications on File Prior to Visit  Medication Sig Dispense Refill  . labetalol (NORMODYNE) 100 MG tablet Take 1 tablet (100 mg total) by mouth 2 (two) times daily. 180 tablet 3  . Prenatal Vit-Fe Fumarate-FA (PRENATAL MULTIVITAMIN) TABS tablet Take 1 tablet by mouth daily at 12 noon.     No current facility-administered medications on file prior to visit.     No Known Allergies   Objective:  BP 116/80   Pulse 72   Temp 98.1 F (36.7 C) (Oral)   Resp 16   Ht 5' 4"  (1.626 m)   Wt 220 lb 12.8 oz (100.2 kg)   LMP 05/19/2017   SpO2 99%   BMI 37.90 kg/m   Physical Exam  Constitutional: She is oriented to person, place, and time and well-developed, well-nourished, and in no distress. No distress.  Neck: Carotid bruit is not present.  Cardiovascular: Normal rate, regular rhythm and  normal heart sounds.  Musculoskeletal:       Right ankle: She exhibits no swelling.       Left ankle: She exhibits no swelling.       Right lower leg: She exhibits no edema.       Left lower leg: She exhibits no edema.  Neurological: She is alert and oriented to person, place, and time. GCS score is 15.  Skin: Skin is warm and dry.  Psychiatric: Mood, memory, affect and judgment normal.  Vitals reviewed.  Results for orders placed or performed in visit on 06/12/17  POCT urinalysis dipstick  Result Value Ref Range   Color, UA yellow yellow   Clarity, UA clear clear   Glucose, UA negative negative mg/dL   Bilirubin, UA negative negative   Ketones, POC UA negative negative mg/dL   Spec Grav, UA 1.020 1.010 - 1.025   Blood, UA negative negative   pH, UA 7.0 5.0 - 8.0   Protein Ur, POC negative negative mg/dL   Urobilinogen, UA 0.2 0.2 or 1.0 E.U./dL   Nitrite, UA Negative Negative   Leukocytes, UA Small (1+) (A) Negative    Assessment and Plan :  1. Essential hypertension 2. Encounter for medication management - Lipid panel - CMP14+EGFR - POCT urinalysis dipstick - labetalol (NORMODYNE) 100 MG tablet; Take 1 tablet (100 mg total) by mouth 2 (two) times daily.  Dispense: 180 tablet; Refill: 3 - Pt presents for blood  pressure check. Controlled. Today's blood pressure is 116/80. OK to refill for 1 year. Routine labs are pending.   Mercer Pod, PA-C  [facility] [place] 06/12/2017 8:38 AM

## 2017-06-13 LAB — CMP14+EGFR
ALT: 30 IU/L (ref 0–32)
AST: 22 IU/L (ref 0–40)
Albumin/Globulin Ratio: 1.7 (ref 1.2–2.2)
Albumin: 4.7 g/dL (ref 3.5–5.5)
Alkaline Phosphatase: 77 IU/L (ref 39–117)
BUN/Creatinine Ratio: 16 (ref 9–23)
BUN: 10 mg/dL (ref 6–24)
Bilirubin Total: 1.2 mg/dL (ref 0.0–1.2)
CO2: 22 mmol/L (ref 20–29)
Calcium: 9.3 mg/dL (ref 8.7–10.2)
Chloride: 103 mmol/L (ref 96–106)
Creatinine, Ser: 0.64 mg/dL (ref 0.57–1.00)
GFR calc Af Amer: 128 mL/min/{1.73_m2} (ref >59)
GFR calc non Af Amer: 111 mL/min/{1.73_m2} (ref >59)
Globulin, Total: 2.7 g/dL (ref 1.5–4.5)
Glucose: 101 mg/dL — ABNORMAL HIGH (ref 65–99)
Potassium: 4.2 mmol/L (ref 3.5–5.2)
Sodium: 140 mmol/L (ref 134–144)
Total Protein: 7.4 g/dL (ref 6.0–8.5)

## 2017-06-13 LAB — LIPID PANEL
Chol/HDL Ratio: 2.8 ratio (ref 0.0–4.4)
Cholesterol, Total: 151 mg/dL (ref 100–199)
HDL: 53 mg/dL (ref >39)
LDL Calculated: 83 mg/dL (ref 0–99)
Triglycerides: 76 mg/dL (ref 0–149)
VLDL Cholesterol Cal: 15 mg/dL (ref 5–40)

## 2017-07-20 ENCOUNTER — Encounter: Payer: Self-pay | Admitting: Physician Assistant

## 2017-08-12 ENCOUNTER — Encounter: Payer: Self-pay | Admitting: Emergency Medicine

## 2017-08-12 ENCOUNTER — Ambulatory Visit: Payer: Self-pay | Admitting: Emergency Medicine

## 2017-08-12 ENCOUNTER — Other Ambulatory Visit: Payer: Self-pay

## 2017-08-12 VITALS — BP 136/80 | HR 70 | Temp 98.5°F | Resp 16 | Ht 64.0 in | Wt 226.0 lb

## 2017-08-12 DIAGNOSIS — R42 Dizziness and giddiness: Secondary | ICD-10-CM

## 2017-08-12 DIAGNOSIS — I10 Essential (primary) hypertension: Secondary | ICD-10-CM

## 2017-08-12 NOTE — Patient Instructions (Addendum)
IF you received an x-ray today, you will receive an invoice from [facility]. Please contact [facility] at [phone] with questions or concerns regarding your invoice.   IF you received labwork today, you will receive an invoice from [facility]. Please contact [facility] at [phone] with questions or concerns regarding your invoice.   Our billing staff will not be able to assist you with questions regarding bills from these companies.  You will be contacted with the lab results as soon as they are available. The fastest way to get your results is to activate your My Chart account. Instructions are located on the last page of this paperwork. If you have not heard from [place] regarding the results in 2 weeks, please contact this office.     Cmo tomarse la presin arterial How to Take Your Blood Pressure Usted puede tomar su presin arterial en casa con un aparato. Es posible que tenga que tomar su presin arterial en casa:  Para ver si tiene presin arterial elevada (hipertensin).  Para controlar su presin arterial a lo largo del tiempo.  Para asegurarse de que el medicamento para la presin arterial est funcionando.  Materiales necesarios: Designer, industrial/product un aparato de medicin de la presin arterial o tensimetro. Puede comprar uno en una farmacia o en lnea. Al escoger uno:  Escoja uno que tenga un brazalete.  Escoja uno que se envuelva en la parte superior de su brazo. Debe poder meter nicamente un dedo entre el brazalete y Water engineer.  No escoja uno que mida su presin arterial en la mueca o el dedo.  Su mdico puede sugerirle un tensimetro. Cmo prepararse Evite realizar lo siguiente durante los 30 minutos anteriores a Chief Technology Officer su presin arterial:  Economist.  Consumir alcohol.  Comer.  Fumar.  Realizar actividad fsica.  Cinco minutos antes de controlar su presin arterial:  Orine.  Sintese en una silla de comedor. Evite sentarse en  un silln blando o sof.  Est tranquilo. No hable.  Cmo tomarse la presin arterial Siga las instrucciones que vienen con el aparato. Si tiene Best boy, las instrucciones podran ser las siguientes: 1. Sintese con la espalda recta. 2. Coloque los pies en el piso. No cruce los tobillos o las piernas. 3. Apoye el brazo izquierdo al nivel del corazn. Puede apoyarlo en una mesa, escritorio o silla. 4. Arremnguese. 5. Envuelva la parte superior de su brazo izquierdo con el brazalete. El brazalete debe estar 1 pulgada (2,5 cm) sobre su codo. Es mejor Production manager brazalete alrededor de la piel [place]. 6. Ajuste el brazalete alrededor de su brazo. Debe poder meter nicamente un dedo entre el brazalete y Water engineer. 7. Coloque el cable dentro del surco de su codo. 8. Presione el botn de encendido. 9. Qudese sentado tranquilamente mientras el brazalete se infla y se desinfla. 10. Escriba los nmeros que se muestran en la pantalla. 11. Espere 2o 3 minutos y repita los pasos 1al10.  Qu significan los nmeros? Dos nmeros conforman la presin arterial. El primer nmero es la presin sistlica. El segundo nmero es la presin diastlica. Un ejemplo de lectura de presin arterial sera "120 sobre 80" (o 120/80). Si es adulto y no tiene Solicitor, use esta gua para saber si su presin arterial es normal: Normal  Primer nmero: debajo de 120.  Segundo nmero: debajo de 80. Elevada  Primer nmero: 120-129.  Segundo nmero: debajo de 80. Etapa 1 de hipertensin  Primer nmero: 130-139.  Segundo nmero:  80-89. Etapa 2 de hipertensin  Primer nmero: 140 o ms.  Segundo nmero: 90 o ms. Su presin arterial se encuentra por encima del nivel normal incluso si nicamente el nmero superior o el inferior es mayor que el normal. Siga estas instrucciones en su casa:  Controle su presin arterial con la frecuencia que le indique su mdico.  Lleve el tensimetro a  su prxima cita con el mdico. Su mdico: ? Se asegurar de que lo est usando correctamente. ? Se asegurar de que funcione bien.  Se asegurar de que entienda cules deben ser sus nmeros de presin arterial.  Dgale al mdico si sus medicamentos le causan efectos secundarios. Comunquese con un mdico si:  Su presin arterial sigue alta. Solicite ayuda de inmediato si:  Su primer nmero de presin arterial es ms alto que 180.  Su segundo nmero de presin arterial es ms alto que 120. Esta informacin no tiene Marine scientist el consejo del mdico. Asegrese de hacerle al mdico cualquier pregunta que tenga. Document Released: 10/08/2010 Document Revised: 06/04/2016 Document Reviewed: 11/30/2015 Elsevier Interactive Patient Education  2018 [employer].  Hipertensin Hypertension El trmino hipertensin es otra forma de denominar a la presin arterial elevada. La presin arterial elevada fuerza al corazn a trabajar ms para bombear la sangre. Esto puede causar problemas con el paso del [place]. Una lectura de presin arterial est compuesta por 2 nmeros. Hay un nmero superior (sistlico) sobre un nmero inferior (diastlico). Lo ideal es tener la presin arterial por debajo de 120/80. Las decisiones saludables pueden ayudarle a disminuir su presin arterial. Es posible que necesite medicamentos que le ayuden a disminuir su presin arterial si:  Su presin arterial no disminuye mediante decisiones saludables.  Su presin arterial est por encima de 130/80.  Siga estas instrucciones en su casa: Comida y bebida  Si se lo indican, siga el plan de alimentacin de DASH (Dietary Approaches to Stop Hypertension, Maneras de alimentarse para detener la hipertensin). Esta dieta incluye: ? Que la mitad del plato de cada comida sea de frutas y verduras. ? Que un cuarto del plato de cada comida sea de cereales integrales. Los cereales integrales incluyen pasta integral, arroz integral  y pan integral. ? Comer y beber productos lcteos con bajo contenido de [place], como leche descremada o yogur bajo en grasas. ? Que un cuarto del plato de cada comida sea de protenas bajas en grasa (magras). Las protenas bajas en grasa incluyen pescado, pollo sin piel, huevos, frijoles y tofu. ? Evitar consumir carne grasa, carne curada y procesada, o pollo con piel. ? Evitar consumir alimentos prehechos o procesados.  Consuma menos de 1500 mg de sal (sodio) por da.  Limite el consumo de alcohol a no ms de 1 medida por da si es mujer y no est Music therapist y a 2 medidas por da si es hombre. Una medida equivale a 12onzas de cerveza, 5onzas de vino o 1onzas de bebidas alcohlicas de alta graduacin. Estilo de vida  Trabaje con su mdico para mantenerse en un peso saludable o para perder peso. Pregntele a su mdico cul es el peso recomendable para usted.  Realice al menos 30 minutos de ejercicio que haga que se acelere su corazn (ejercicio Arboriculturist) la [employer] de la [place]. Estos pueden incluir caminar, nadar o andar en bicicleta.  Realice al menos 30 minutos de ejercicio que fortalezca sus msculos (ejercicios de resistencia) al menos 3 das a la [place]. Estos pueden incluir levantar pesas o hacer  pilates.  No consuma ningn producto que contenga nicotina o tabaco. Esto incluye cigarrillos y cigarrillos electrnicos. Si necesita ayuda para dejar de fumar, consulte al [employer].  Controle su presin arterial en su casa tal como le indic el mdico.  Concurra a todas las visitas de control como se lo haya indicado el mdico. Esto es importante. Medicamentos  [employer] de venta libre y los recetados solamente como se lo haya indicado el mdico. Siga cuidadosamente las indicaciones.  No omita las dosis de medicamentos para la presin arterial. Los medicamentos pierden eficacia si omite dosis. El hecho de omitir las dosis tambin [place] el riesgo de otros  problemas.  Pregntele a su mdico a qu efectos secundarios o reacciones a los Careers information officer. Comunquese con un mdico si:  Piensa que tiene [place] reaccin a los medicamentos que est tomando.  Tiene dolores de cabeza frecuentes (recurrentes).  Siente mareos.  Tiene hinchazn en los tobillos.  Tiene problemas de visin. Solicite ayuda de inmediato si:  Siente un dolor de cabeza muy intenso.  Comienza a sentirse confundido.  Se siente dbil o adormecido.  Siente que va a desmayarse.  Siente un dolor muy intenso en: ? El pecho. ? El vientre (abdomen).  Devuelve (vomita) ms de una vez.  Tiene dificultad para respirar. Resumen  El trmino hipertensin es otra forma de denominar a la presin arterial elevada.  Las decisiones saludables pueden ayudarle a disminuir su presin arterial. Si no puede controlar su presin arterial mediante decisiones saludables, es posible que deba tomar medicamentos. Esta informacin no tiene Marine scientist el consejo del mdico. Asegrese de hacerle al mdico cualquier pregunta que tenga. Document Released: 12/11/2009 Document Revised: 06/04/2016 Document Reviewed: 06/04/2016 Elsevier Interactive Patient Education  [employer].

## 2017-08-12 NOTE — Progress Notes (Signed)
Kristen Madden 42 y.o.   Chief Complaint  Patient presents with  . Dizziness    per patient x 5 days ago and checked blood pressure at [facility] 151/91  . Establish Care    HISTORY OF PRESENT ILLNESS: This is a 41 y.o. female with history of hypertension.  Compliant with medication.  Last Saturday she was cleaning with a chlorine solution.  Soon after started feeling dizzy with some vertigo.  It lasted for a while but she was feeling better by the next day.  Later on that same day she went to [facility], had blood pressure checked, and it was 151/91.  Slowly getting better.  Today almost totally asymptomatic.  Wants to establish care with me.  HPI   Prior to Admission medications   Medication Sig Start Date End Date Taking? Authorizing Provider  labetalol (NORMODYNE) 100 MG tablet Take 1 tablet (100 mg total) by mouth 2 (two) times daily. 06/12/17  Yes McVey, Gelene Mink, PA-C  Prenatal Vit-Fe Fumarate-FA (PRENATAL MULTIVITAMIN) TABS tablet Take 1 tablet by mouth daily at 12 noon.    [provider]    No Known Allergies  Patient Active Problem List   Diagnosis Date Noted  . Gallstone pancreatitis 05/14/2014  . HTN (hypertension) 06/28/2013    Past Medical History:  Diagnosis Date  . Hypertension   . Ovarian cyst     Past Surgical History:  Procedure Laterality Date  . CHOLECYSTECTOMY N/A 05/16/2014   Procedure: LAPAROSCOPIC CHOLECYSTECTOMY WITH INTRAOPERATIVE CHOLANGIOGRAM;  Surgeon: [name], MD;  Location: [place];  Service: General;  Laterality: N/A;  . NO PAST SURGERIES      Social History   Socioeconomic History  . Marital status: Married    Spouse name: Not on file  . Number of children: Not on file  . Years of education: Not on file  . Highest education level: Not on file  Social Needs  . Financial resource strain: Not on file  . Food insecurity - worry: Not on file  . Food insecurity - inability: Not on file  . Transportation  needs - medical: Not on file  . Transportation needs - non-medical: Not on file  Occupational History  . Not on file  Tobacco Use  . Smoking status: Never Smoker  . Smokeless tobacco: Never Used  Substance and Sexual Activity  . Alcohol use: No  . Drug use: No  . Sexual activity: Yes    Birth control/protection: Condom  Other Topics Concern  . Not on file  Social History Narrative  . Not on file    Family History  Problem Relation Age of Onset  . Hypertension Mother      Review of Systems  Constitutional: Negative.  Negative for chills, fever and weight loss.  HENT: Negative.  Negative for congestion, ear pain, nosebleeds and sore throat.   Eyes: Negative.  Negative for blurred vision, double vision and photophobia.  Respiratory: Negative.  Negative for cough, shortness of breath and wheezing.   Cardiovascular: Negative.  Negative for chest pain and palpitations.  Gastrointestinal: Negative.  Negative for abdominal pain, diarrhea, nausea and vomiting.  Genitourinary: Negative.  Negative for dysuria and hematuria.  Musculoskeletal: Negative for back pain, myalgias and neck pain.  Skin: Negative for rash.  Neurological: Positive for dizziness (Resolved now). Negative for sensory change, speech change, focal weakness, loss of consciousness and headaches.  Endo/Heme/Allergies: Negative.   All other systems reviewed and are negative.  Vitals:  08/12/17 1147  BP: 136/80  Pulse: 70  Resp: 16  Temp: 98.5 F (36.9 C)  SpO2: 98%     Physical Exam  Constitutional: She is oriented to person, place, and time. She appears well-developed and well-nourished.  HENT:  Head: Normocephalic and atraumatic.  Right Ear: External ear normal.  Left Ear: External ear normal.  Nose: Nose normal.  Mouth/Throat: Oropharynx is clear and moist.  Eyes: Conjunctivae and EOM are normal. Pupils are equal, round, and reactive to light.  Neck: Normal range of motion. Neck supple. No JVD  present.  Cardiovascular: Normal rate, regular rhythm, normal heart sounds and intact distal pulses.  Pulmonary/Chest: Effort normal and breath sounds normal.  Abdominal: Soft. There is no tenderness.  Musculoskeletal: Normal range of motion.  Lymphadenopathy:    She has no cervical adenopathy.  Neurological: She is alert and oriented to person, place, and time. She displays normal reflexes. No cranial nerve deficit or sensory deficit. She exhibits normal muscle tone. Coordination normal.  Skin: Skin is warm and dry. Capillary refill takes less than 2 seconds. No rash noted.  Psychiatric: She has a normal mood and affect. Her behavior is normal.  Vitals reviewed.    ASSESSMENT & PLAN: Kristen Madden was seen today for dizziness and establish care.  Diagnoses and all orders for this visit:  Dizziness  Essential hypertension    Patient Instructions       IF you received an x-ray today, you will receive an invoice from [facility]. Please contact [facility] at [phone] with questions or concerns regarding your invoice.   IF you received labwork today, you will receive an invoice from [facility]. Please contact [facility] at [phone] with questions or concerns regarding your invoice.   Our billing staff will not be able to assist you with questions regarding bills from these companies.  You will be contacted with the lab results as soon as they are available. The fastest way to get your results is to activate your My Chart account. Instructions are located on the last page of this paperwork. If you have not heard from [place] regarding the results in 2 weeks, please contact this office.     Cmo tomarse la presin arterial How to Take Your Blood Pressure Usted puede tomar su presin arterial en casa con un aparato. Es posible que tenga que tomar su presin arterial en casa:  Para ver si tiene presin arterial elevada (hipertensin).  Para controlar su presin  arterial a lo largo del tiempo.  Para asegurarse de que el medicamento para la presin arterial est funcionando.  Materiales necesarios: Designer, industrial/product un aparato de medicin de la presin arterial o tensimetro. Puede comprar uno en una farmacia o en lnea. Al escoger uno:  Escoja uno que tenga un brazalete.  Escoja uno que se envuelva en la parte superior de su brazo. Debe poder meter nicamente un dedo entre el brazalete y Water engineer.  No escoja uno que mida su presin arterial en la mueca o el dedo.  Su mdico puede sugerirle un tensimetro. Cmo prepararse Evite realizar lo siguiente durante los 30 minutos anteriores a Chief Technology Officer su presin arterial:  Economist.  Consumir alcohol.  Comer.  Fumar.  Realizar actividad fsica.  Cinco minutos antes de controlar su presin arterial:  Orine.  Sintese en una silla de comedor. Evite sentarse en un silln blando o sof.  Est tranquilo. No hable.  Cmo tomarse la presin arterial Siga las instrucciones que vienen con el aparato. Si tiene  un Youth worker, las instrucciones podran ser las siguientes: 1. Sintese con la espalda recta. 2. Coloque los pies en el piso. No cruce los tobillos o las piernas. 3. Apoye el brazo izquierdo al nivel del corazn. Puede apoyarlo en una mesa, escritorio o silla. 4. Arremnguese. 5. Envuelva la parte superior de su brazo izquierdo con el brazalete. El brazalete debe estar 1 pulgada (2,5 cm) sobre su codo. Es mejor Production manager brazalete alrededor de la piel [place]. 6. Ajuste el brazalete alrededor de su brazo. Debe poder meter nicamente un dedo entre el brazalete y Water engineer. 7. Coloque el cable dentro del surco de su codo. 8. Presione el botn de encendido. 9. Qudese sentado tranquilamente mientras el brazalete se infla y se desinfla. 10. Escriba los nmeros que se muestran en la pantalla. 11. Espere 2o 3 minutos y repita los pasos 1al10.  Qu significan los nmeros? Dos  nmeros conforman la presin arterial. El primer nmero es la presin sistlica. El segundo nmero es la presin diastlica. Un ejemplo de lectura de presin arterial sera "120 sobre 80" (o 120/80). Si es adulto y no tiene Solicitor, use esta gua para saber si su presin arterial es normal: Normal  Primer nmero: debajo de 120.  Segundo nmero: debajo de 80. Elevada  Primer nmero: 120-129.  Segundo nmero: debajo de 80. Etapa 1 de hipertensin  Primer nmero: 130-139.  Segundo nmero: 80-89. Etapa 2 de hipertensin  Primer nmero: 140 o ms.  Segundo nmero: 90 o ms. Su presin arterial se encuentra por encima del nivel normal incluso si nicamente el nmero superior o el inferior es mayor que el normal. Siga estas instrucciones en su casa:  Controle su presin arterial con la frecuencia que le indique su mdico.  Lleve el tensimetro a su prxima cita con el mdico. Su mdico: ? Se asegurar de que lo est usando correctamente. ? Se asegurar de que funcione bien.  Se asegurar de que entienda cules deben ser sus nmeros de presin arterial.  Dgale al mdico si sus medicamentos le causan efectos secundarios. Comunquese con un mdico si:  Su presin arterial sigue alta. Solicite ayuda de inmediato si:  Su primer nmero de presin arterial es ms alto que 180.  Su segundo nmero de presin arterial es ms alto que 120. Esta informacin no tiene Marine scientist el consejo del mdico. Asegrese de hacerle al mdico cualquier pregunta que tenga. Document Released: 10/08/2010 Document Revised: 06/04/2016 Document Reviewed: 11/30/2015 Elsevier Interactive Patient Education  2018 [employer].  Hipertensin Hypertension El trmino hipertensin es otra forma de denominar a la presin arterial elevada. La presin arterial elevada fuerza al corazn a trabajar ms para bombear la sangre. Esto puede causar problemas con el paso del [place]. Una lectura de  presin arterial est compuesta por 2 nmeros. Hay un nmero superior (sistlico) sobre un nmero inferior (diastlico). Lo ideal es tener la presin arterial por debajo de 120/80. Las decisiones saludables pueden ayudarle a disminuir su presin arterial. Es posible que necesite medicamentos que le ayuden a disminuir su presin arterial si:  Su presin arterial no disminuye mediante decisiones saludables.  Su presin arterial est por encima de 130/80.  Siga estas instrucciones en su casa: Comida y bebida  Si se lo indican, siga el plan de alimentacin de DASH (Dietary Approaches to Stop Hypertension, Maneras de alimentarse para detener la hipertensin). Esta dieta incluye: ? Que la mitad del plato de cada comida sea de frutas y verduras. ? Que un  cuarto del plato de cada comida sea de cereales integrales. Los cereales integrales incluyen pasta integral, arroz integral y pan integral. ? Comer y beber productos lcteos con bajo contenido de [place], como leche descremada o yogur bajo en grasas. ? Que un cuarto del plato de cada comida sea de protenas bajas en grasa (magras). Las protenas bajas en grasa incluyen pescado, pollo sin piel, huevos, frijoles y tofu. ? Evitar consumir carne grasa, carne curada y procesada, o pollo con piel. ? Evitar consumir alimentos prehechos o procesados.  Consuma menos de 1500 mg de sal (sodio) por da.  Limite el consumo de alcohol a no ms de 1 medida por da si es mujer y no est Music therapist y a 2 medidas por da si es hombre. Una medida equivale a 12onzas de cerveza, 5onzas de vino o 1onzas de bebidas alcohlicas de alta graduacin. Estilo de vida  Trabaje con su mdico para mantenerse en un peso saludable o para perder peso. Pregntele a su mdico cul es el peso recomendable para usted.  Realice al menos 30 minutos de ejercicio que haga que se acelere su corazn (ejercicio Arboriculturist) la [employer] de la [place]. Estos pueden incluir caminar, nadar  o andar en bicicleta.  Realice al menos 30 minutos de ejercicio que fortalezca sus msculos (ejercicios de resistencia) al menos 3 das a la [place]. Estos pueden incluir levantar pesas o hacer pilates.  No consuma ningn producto que contenga nicotina o tabaco. Esto incluye cigarrillos y cigarrillos electrnicos. Si necesita ayuda para dejar de fumar, consulte al [employer].  Controle su presin arterial en su casa tal como le indic el mdico.  Concurra a todas las visitas de control como se lo haya indicado el mdico. Esto es importante. Medicamentos  [employer] de venta libre y los recetados solamente como se lo haya indicado el mdico. Siga cuidadosamente las indicaciones.  No omita las dosis de medicamentos para la presin arterial. Los medicamentos pierden eficacia si omite dosis. El hecho de omitir las dosis tambin [place] el riesgo de otros problemas.  Pregntele a su mdico a qu efectos secundarios o reacciones a los Careers information officer. Comunquese con un mdico si:  Piensa que tiene [place] reaccin a los medicamentos que est tomando.  Tiene dolores de cabeza frecuentes (recurrentes).  Siente mareos.  Tiene hinchazn en los tobillos.  Tiene problemas de visin. Solicite ayuda de inmediato si:  Siente un dolor de cabeza muy intenso.  Comienza a sentirse confundido.  Se siente dbil o adormecido.  Siente que va a desmayarse.  Siente un dolor muy intenso en: ? El pecho. ? El vientre (abdomen).  Devuelve (vomita) ms de una vez.  Tiene dificultad para respirar. Resumen  El trmino hipertensin es otra forma de denominar a la presin arterial elevada.  Las decisiones saludables pueden ayudarle a disminuir su presin arterial. Si no puede controlar su presin arterial mediante decisiones saludables, es posible que deba tomar medicamentos. Esta informacin no tiene Marine scientist el consejo del mdico. Asegrese de hacerle al mdico  cualquier pregunta que tenga. Document Released: 12/11/2009 Document Revised: 06/04/2016 Document Reviewed: 06/04/2016 Elsevier Interactive Patient Education  2018 Elsevier Inc.      Agustina Caroli, MD [facility]

## 2018-01-08 ENCOUNTER — Ambulatory Visit: Payer: Self-pay | Admitting: Family Medicine

## 2018-01-08 ENCOUNTER — Encounter: Payer: Self-pay | Admitting: Family Medicine

## 2018-01-08 ENCOUNTER — Other Ambulatory Visit: Payer: Self-pay

## 2018-01-08 VITALS — BP 124/80 | HR 60 | Temp 98.4°F | Ht 63.5 in | Wt 227.0 lb

## 2018-01-08 DIAGNOSIS — R1030 Lower abdominal pain, unspecified: Secondary | ICD-10-CM

## 2018-01-08 DIAGNOSIS — N898 Other specified noninflammatory disorders of vagina: Secondary | ICD-10-CM

## 2018-01-08 LAB — POCT WET + KOH PREP
Trich by wet prep: ABSENT
Yeast by KOH: ABSENT
Yeast by wet prep: ABSENT

## 2018-01-08 LAB — POCT URINALYSIS DIP (MANUAL ENTRY)
Bilirubin, UA: NEGATIVE
Blood, UA: NEGATIVE
Glucose, UA: NEGATIVE mg/dL
Ketones, POC UA: NEGATIVE mg/dL
Leukocytes, UA: NEGATIVE
Nitrite, UA: NEGATIVE
Protein Ur, POC: NEGATIVE mg/dL
Spec Grav, UA: 1.015 (ref 1.010–1.025)
Urobilinogen, UA: 0.2 E.U./dL
pH, UA: 7 (ref 5.0–8.0)

## 2018-01-08 LAB — POC MICROSCOPIC URINALYSIS (UMFC): Mucus: ABSENT

## 2018-01-08 LAB — POCT URINE PREGNANCY: Preg Test, Ur: NEGATIVE

## 2018-01-08 NOTE — Patient Instructions (Addendum)
     IF you received an x-ray today, you will receive an invoice from [facility]. Please contact [facility] at [phone] with questions or concerns regarding your invoice.   IF you received labwork today, you will receive an invoice from [facility]. Please contact [facility] at [phone] with questions or concerns regarding your invoice.   Our billing staff will not be able to assist you with questions regarding bills from these companies.  You will be contacted with the lab results as soon as they are available. The fastest way to get your results is to activate your My Chart account. Instructions are located on the last page of this paperwork. If you have not heard from [place] regarding the results in 2 weeks, please contact this office.      Dolor plvico en la mujer (Pelvic Pain, Female) El dolor plvico se percibe en la parte baja del abdomen, por debajo del ombligo y [employer]. El dolor puede comenzar de forma repentina (agudo), reaparecer (recurrente) o durar mucho tiempo (crnico). Se considera que el dolor plvico que dura ms de seis meses es crnico. El dolor plvico puede tener numerosas causas. A veces, la causa no se conoce. CUIDADOS EN EL HOGAR  Tome los medicamentos de venta libre y los recetados solamente como se lo haya indicado el mdico.  Haga reposo como se lo haya indicado el mdico.  No tenga relaciones sexuales si le causan dolor.  Lleve un registro del dolor plvico. Escriba los siguientes datos: ? Cundo Energy manager. ? La ubicacin del dolor. ? Qu cosas parecen Fish farm manager, como los alimentos o la [place]. ? Los sntomas que tiene junto con Conservation officer, historic buildings.  Concurra a todas las visitas de control como se lo haya indicado el mdico. Esto es importante. SOLICITE AYUDA SI:  Los medicamentos no Forensic psychologist.  El dolor reaparece.  Aparecen nuevos sntomas.  Tiene secrecin o sangrado vaginal  atpico.  Tiene fiebre o siente escalofros.  Tiene dificultad para defecar (estreimiento).  Observa sangre en la orina o en la materia fecal.  La orina tiene mal olor.  Se siente mareada o dbil. SOLICITE AYUDA DE INMEDIATO SI:  Siente un dolor repentino que es muy intenso.  El dolor contina empeorando.  El dolor es muy intenso y, [place], tiene alguno de los siguientes sntomas: ? Cristy Hilts. ? Ganas de vomitar (nuseas). ? Vmitos. ? Santo Held.  Se desmaya (pierde el conocimiento). Esta informacin no tiene Marine scientist el consejo del mdico. Asegrese de hacerle al mdico cualquier pregunta que tenga. Document Released: 12/23/2011 Document Revised: 07/14/2014 Document Reviewed: 04/13/2015 Elsevier Interactive Patient Education  [employer].

## 2018-01-08 NOTE — Progress Notes (Signed)
7/5/20198:52 AM  Kristen Madden Jul 20, 1975, 42 y.o. female [id number]  Chief Complaint  Patient presents with  . vaginal discarge    with smell and itchy for the last 3 weeks. She has been feeling bloated as well.     HPI:   Patient is a 43 y.o. female with past medical history significant for HTN who presents today for vaginal discharge  3 weeks ago started with a white greenish foul smelling vaginal discharge with irritation Did 3 days of miconozale vaginal discharge better but now with lower abd fulness, cont with irritation  Denies any dyspareunia, dysuria or hematuria Sexually active, no BC used Reports a home preg test of negative, last menses very light Denies any fatigue, nausea, vomiting, breast tenderness  Pap with DOH, reports has upcoming appt  Fall Risk  01/08/2018 08/12/2017 06/12/2017 06/23/2016  Falls in the past year? No No No No     Depression screen [facility] 2/9 01/08/2018 08/12/2017 06/12/2017  Decreased Interest 0 0 0  Down, Depressed, Hopeless 0 0 0  PHQ - 2 Score 0 0 0    No Known Allergies  Prior to Admission medications   Medication Sig Start Date End Date Taking? Authorizing Provider  labetalol (NORMODYNE) 100 MG tablet Take 1 tablet (100 mg total) by mouth 2 (two) times daily. 06/12/17  Yes McVey, Gelene Mink, PA-C    Past Medical History:  Diagnosis Date  . Hypertension   . Ovarian cyst     Past Surgical History:  Procedure Laterality Date  . CHOLECYSTECTOMY N/A 05/16/2014   Procedure: LAPAROSCOPIC CHOLECYSTECTOMY WITH INTRAOPERATIVE CHOLANGIOGRAM;  Surgeon: [name], MD;  Location: [place];  Service: General;  Laterality: N/A;  . NO PAST SURGERIES      Social History   Tobacco Use  . Smoking status: Never Smoker  . Smokeless tobacco: Never Used  Substance Use Topics  . Alcohol use: No    Family History  Problem Relation Age of Onset  . Hypertension Mother     ROS Per hpi  OBJECTIVE:  Blood pressure 124/80,  pulse 60, temperature 98.4 F (36.9 C), temperature source Oral, height 5' 3.5" (1.613 m), weight 227 lb (103 kg), last menstrual period 12/25/2017, SpO2 98 %, currently breastfeeding.  Physical Exam  Constitutional: She appears well-developed and well-nourished.  Genitourinary: Pelvic exam was performed with patient prone. There is no rash or lesion on the right labia. There is no rash or lesion on the left labia. Uterus is not fixed and not tender. Cervix exhibits friability. Cervix exhibits no motion tenderness and no discharge. Right adnexum displays no mass and no tenderness. Left adnexum displays no mass and no tenderness. No erythema in the vagina. Vaginal discharge (scant greenish discharge, no odor) found.  Nursing note and vitals reviewed.   Results for orders placed or performed in visit on 01/08/18 (from the past 24 hour(s))  POCT urine pregnancy     Status: None   Collection Time: 01/08/18  9:27 AM  Result Value Ref Range   Preg Test, Ur Negative Negative  POCT Wet + KOH Prep     Status: Abnormal   Collection Time: 01/08/18  9:35 AM  Result Value Ref Range   Yeast by KOH Absent Absent   Yeast by wet prep Absent Absent   WBC by wet prep Moderate (A) Few   Clue Cells Wet Prep HPF POC None None   Trich by wet prep Absent Absent   Bacteria Wet Prep HPF POC  Moderate (A) Few   Epithelial Cells By [employer] Pref ([facility]) Many (A) None, Few, Too numerous to count   RBC,UR,HPF,POC None None RBC/hpf  POCT urinalysis dipstick     Status: None   Collection Time: 01/08/18  9:47 AM  Result Value Ref Range   Color, UA yellow yellow   Clarity, UA clear clear   Glucose, UA negative negative mg/dL   Bilirubin, UA negative negative   Ketones, POC UA negative negative mg/dL   Spec Grav, UA 1.015 1.010 - 1.025   Blood, UA negative negative   pH, UA 7.0 5.0 - 8.0   Protein Ur, POC negative negative mg/dL   Urobilinogen, UA 0.2 0.2 or 1.0 E.U./dL   Nitrite, UA Negative Negative   Leukocytes, UA  Negative Negative  POCT Microscopic Urinalysis ([facility])     Status: Abnormal   Collection Time: 01/08/18  9:56 AM  Result Value Ref Range   WBC,UR,HPF,POC None None WBC/hpf   RBC,UR,HPF,POC None None RBC/hpf   Bacteria None None, Too numerous to count   Mucus Absent Absent   Epithelial Cells, UR Per Microscopy Few (A) None, Too numerous to count cells/hpf    ASSESSMENT and PLAN  1. Lower abdominal pain So far no clear etiology, gc/ch pending. Discussed importance of regular BM. Discussed RTC precautions.  - POCT urine pregnancy - GC/Chlamydia Probe Amp([facility]) - POCT urinalysis dipstick - POCT Microscopic Urinalysis ([facility])  2. Vaginal itching - POCT Wet + KOH Prep  3. Vaginal discharge - GC/Chlamydia Probe Amp([facility])  Return if symptoms worsen or fail to improve.    Rutherford Guys, MD [facility] [place], [place] [address] Ph.  [phone] Fax [phone]

## 2018-01-09 LAB — GC/CHLAMYDIA PROBE AMP
Chlamydia trachomatis, NAA: NEGATIVE
Neisseria gonorrhoeae by PCR: NEGATIVE

## 2018-02-09 ENCOUNTER — Ambulatory Visit: Payer: Self-pay | Admitting: Emergency Medicine

## 2018-04-28 ENCOUNTER — Encounter: Payer: Self-pay | Admitting: Obstetrics and Gynecology

## 2018-04-28 ENCOUNTER — Ambulatory Visit (INDEPENDENT_AMBULATORY_CARE_PROVIDER_SITE_OTHER): Payer: Self-pay | Admitting: Obstetrics and Gynecology

## 2018-04-28 VITALS — BP 145/82 | HR 64 | Ht 63.0 in | Wt 229.6 lb

## 2018-04-28 DIAGNOSIS — R3989 Other symptoms and signs involving the genitourinary system: Secondary | ICD-10-CM

## 2018-04-28 DIAGNOSIS — Z789 Other specified health status: Secondary | ICD-10-CM

## 2018-04-28 DIAGNOSIS — R102 Pelvic and perineal pain: Secondary | ICD-10-CM

## 2018-04-28 NOTE — Progress Notes (Signed)
GYNECOLOGY OFFICE FOLLOW UP NOTE  History:  42 y.o. [id number] here today for follow up for lower abdominal pain.  Feels lower abdominal "inflammation", feels swollen and like "something is there." Has been happening almost every day, today she is feeling is somewhat less. Feels like "stabbing" pain, rates it it 7/10, she doesn't think it is very bad, she is more concerned that she feels like it is swollen. She is not having much pain today. She also has times when she feels a hard, rock in her lower abdomen, does not feel it today. Has had this pain since June. Has regular monthly periods lasting 4-5 days, this pain does not correlate with her periods. Denies any nausea/vomiting, poor appetite. Reports stools 3x/day, they are formed, no diarrhea or hard stools. Was treated for UTI but no change in her pain.   Reports pap and TVUS done at [facility] approx 2 months ago. Records not available, patient reports  Past Medical History:  Diagnosis Date  . Hypertension   . Ovarian cyst    Past Surgical History:  Procedure Laterality Date  . CHOLECYSTECTOMY N/A 05/16/2014   Procedure: LAPAROSCOPIC CHOLECYSTECTOMY WITH INTRAOPERATIVE CHOLANGIOGRAM;  Surgeon: [name], MD;  Location: [place];  Service: General;  Laterality: N/A;  . NO PAST SURGERIES      Current Outpatient Medications:  .  labetalol (NORMODYNE) 100 MG tablet, Take 1 tablet (100 mg total) by mouth 2 (two) times daily., Disp: 180 tablet, Rfl: 3  The following portions of the patient's history were reviewed and updated as appropriate: allergies, current medications, past family history, past medical history, past social history, past surgical history and problem list.   Review of Systems:  Pertinent items noted in HPI and remainder of comprehensive ROS otherwise negative.   Objective:  Physical Exam BP (!) 145/82   Pulse 64   Ht 5' 3"  (1.6 m)   Wt 229 lb 9.6 oz (104.1 kg)   BMI 40.67 kg/m  CONSTITUTIONAL: Well-developed,  well-nourished female in no acute distress.  HENT:  Normocephalic, atraumatic. External right and left ear normal. Oropharynx is clear and moist EYES: Conjunctivae and EOM are normal. Pupils are equal, round, and reactive to light. No scleral icterus.  NECK: Normal range of motion, supple, no masses SKIN: Skin is warm and dry. No rash noted. Not diaphoretic. No erythema. No pallor. NEUROLOGIC: Alert and oriented to person, place, and time. Normal reflexes, muscle tone coordination. No cranial nerve deficit noted. PSYCHIATRIC: Normal mood and affect. Normal behavior. Normal judgment and thought content. CARDIOVASCULAR: Normal heart rate noted RESPIRATORY: Effort normal, no problems with respiration noted ABDOMEN: Soft, no distention noted.   PELVIC: Normal appearing external genitalia; normal appearing vaginal mucosa and cervix.  No abnormal discharge noted. Normal uterine size, no other palpable masses, no uterine or adnexal tenderness. Tenderness with deep palpation on posterior bladder MUSCULOSKELETAL: Normal range of motion. No edema noted.  Labs and Imaging No results found.  Assessment & Plan:  1. Pelvic pain Appears unrelated to menses, suspect UTI Will obtain records from [facility] pelvic cultures, patient thinks she may have had them done at [facility], will obtain records and swab if not done  2. Bladder pain Pain on exam, suspect UTI as cause for pain Urine culture If no UTI, will review records for other possible etiology  3. Language barrier Patent attorney used  Routine preventative health maintenance measures emphasized. Please refer to After Visit Summary for other counseling recommendations.   Return in  about 2 months (around 06/28/2018) for Followup.  Feliz Beam, M.D. [facility]

## 2018-04-28 NOTE — Progress Notes (Signed)
Pt feels swollen with Pelvic Pain, feels like she has something in her lower abdomen. She's had the pain since June but never took anything for it..just a dull pain.

## 2018-04-29 LAB — URINE CULTURE

## 2018-05-03 ENCOUNTER — Encounter: Payer: Self-pay | Admitting: *Deleted

## 2018-05-12 ENCOUNTER — Encounter: Payer: Self-pay | Admitting: *Deleted

## 2018-06-07 ENCOUNTER — Other Ambulatory Visit: Payer: Self-pay | Admitting: Emergency Medicine

## 2018-06-07 DIAGNOSIS — I10 Essential (primary) hypertension: Secondary | ICD-10-CM

## 2018-06-07 NOTE — Telephone Encounter (Signed)
Copied from [place] [id number]. Topic: Quick Communication - Rx Refill/Question >> Jun 07, 2018  2:20 PM Kristen Madden, NT wrote: Medication: labetalol (NORMODYNE) 100 MG tablet   Has the patient contacted their pharmacy? Yes.   (Agent: If no, request that the patient contact the pharmacy for the refill.) (Agent: If yes, when and what did the pharmacy advise?) Call PCP.   Pt would like to see if enough refill can be sent in to last until her appt on 06/22/18.   Preferred Pharmacy (with phone number or street name): [place], [place]. [phone] (Phone) [phone] (Fax)    Agent: Please be advised that RX refills may take up to 3 business days. We ask that you follow-up with your pharmacy.

## 2018-06-08 ENCOUNTER — Other Ambulatory Visit: Payer: Self-pay

## 2018-06-08 DIAGNOSIS — I10 Essential (primary) hypertension: Secondary | ICD-10-CM

## 2018-06-08 MED ORDER — LABETALOL HCL 100 MG PO TABS: 100.0000 mg | ORAL_TABLET | Freq: Two times a day (BID) | ORAL | 0 refills | Status: DC

## 2018-06-22 ENCOUNTER — Encounter: Payer: Self-pay | Admitting: Emergency Medicine

## 2018-06-22 ENCOUNTER — Ambulatory Visit: Payer: Self-pay | Admitting: Emergency Medicine

## 2018-06-22 ENCOUNTER — Other Ambulatory Visit: Payer: Self-pay

## 2018-06-22 ENCOUNTER — Encounter

## 2018-06-22 DIAGNOSIS — I10 Essential (primary) hypertension: Secondary | ICD-10-CM

## 2018-06-22 MED ORDER — LABETALOL HCL 100 MG PO TABS: 100.0000 mg | ORAL_TABLET | Freq: Two times a day (BID) | ORAL | 3 refills | Status: DC

## 2018-06-22 NOTE — Patient Instructions (Addendum)
If you have lab work done today you will be contacted with your lab results within the next 2 weeks.  If you have not heard from [place] then please contact us. The fastest way to get your results is to register for My Chart.   IF you received an x-ray today, you will receive an invoice from [facility]. Please contact [facility] Radiology at [phone] with questions or concerns regarding your invoice.   IF you received labwork today, you will receive an invoice from [place]. Please contact [facility] at [phone] with questions or concerns regarding your invoice.   Our billing staff will not be able to assist you with questions regarding bills from these companies.  You will be contacted with the lab results as soon as they are available. The fastest way to get your results is to activate your My Chart account. Instructions are located on the last page of this paperwork. If you have not heard from [place] regarding the results in 2 weeks, please contact this office.     Hypertension Hypertension, commonly called high blood pressure, is when the force of blood pumping through the arteries is too strong. The arteries are the blood vessels that carry blood from the heart throughout the body. Hypertension forces the heart to work harder to pump blood and may cause arteries to become narrow or stiff. Having untreated or uncontrolled hypertension can cause heart attacks, strokes, kidney disease, and other problems. A blood pressure reading consists of a higher number over a lower number. Ideally, your blood pressure should be below 120/80. The first ("top") number is called the systolic pressure. It is a measure of the pressure in your arteries as your heart beats. The second ("bottom") number is called the diastolic pressure. It is a measure of the pressure in your arteries as the heart relaxes. What are the causes? The cause of this condition is not known. What increases the risk? Some  risk factors for high blood pressure are under your control. Others are not. Factors you can change  Smoking.  Having type 2 diabetes mellitus, high cholesterol, or both.  Not getting enough exercise or physical activity.  Being overweight.  Having too much fat, sugar, calories, or salt (sodium) in your diet.  Drinking too much alcohol. Factors that are difficult or impossible to change  Having chronic kidney disease.  Having a family history of high blood pressure.  Age. Risk increases with age.  Race. You may be at higher risk if you are African-American.  Gender. Men are at higher risk than women before age 56. After age 42, women are at higher risk than men.  Having obstructive sleep apnea.  Stress. What are the signs or symptoms? Extremely high blood pressure (hypertensive crisis) may cause:  Headache.  Anxiety.  Shortness of breath.  Nosebleed.  Nausea and vomiting.  Severe chest pain.  Jerky movements you cannot control (seizures).  How is this diagnosed? This condition is diagnosed by measuring your blood pressure while you are seated, with your arm resting on a surface. The cuff of the blood pressure monitor will be placed directly against the skin of your upper arm at the level of your heart. It should be measured at least twice using the same arm. Certain conditions can cause a difference in blood pressure between your right and left arms. Certain factors can cause blood pressure readings to be lower or higher than normal (elevated) for a short period of time:  When  your blood pressure is higher when you are in a health care provider's office than when you are at home, this is called white coat hypertension. Most people with this condition do not need medicines.  When your blood pressure is higher at home than when you are in a health care provider's office, this is called masked hypertension. Most people with this condition may need medicines to control  blood pressure.  If you have a high blood pressure reading during one visit or you have normal blood pressure with other risk factors:  You may be asked to return on a different day to have your blood pressure checked again.  You may be asked to monitor your blood pressure at home for 1 week or longer.  If you are diagnosed with hypertension, you may have other blood or imaging tests to help your health care provider understand your overall risk for other conditions. How is this treated? This condition is treated by making healthy lifestyle changes, such as eating healthy foods, exercising more, and reducing your alcohol intake. Your health care provider may prescribe medicine if lifestyle changes are not enough to get your blood pressure under control, and if:  Your systolic blood pressure is above 130.  Your diastolic blood pressure is above 80.  Your personal target blood pressure may vary depending on your medical conditions, your age, and other factors. Follow these instructions at home: Eating and drinking  Eat a diet that is high in fiber and potassium, and low in sodium, added sugar, and fat. An example eating plan is called the DASH (Dietary Approaches to Stop Hypertension) diet. To eat this way: ? Eat plenty of fresh fruits and vegetables. Try to fill half of your plate at each meal with fruits and vegetables. ? Eat whole grains, such as whole wheat pasta, brown rice, or whole grain bread. Fill about one quarter of your plate with whole grains. ? Eat or drink low-fat dairy products, such as skim milk or low-fat yogurt. ? Avoid fatty cuts of meat, processed or cured meats, and poultry with skin. Fill about one quarter of your plate with lean proteins, such as fish, chicken without skin, beans, eggs, and tofu. ? Avoid premade and processed foods. These tend to be higher in sodium, added sugar, and fat.  Reduce your daily sodium intake. Most people with hypertension should eat less  than 1,500 mg of sodium a day.  Limit alcohol intake to no more than 1 drink a day for nonpregnant women and 2 drinks a day for men. One drink equals 12 oz of beer, 5 oz of wine, or 1 oz of hard liquor. Lifestyle  Work with your health care provider to maintain a healthy body weight or to lose weight. Ask what an ideal weight is for you.  Get at least 30 minutes of exercise that causes your heart to beat faster (aerobic exercise) most days of the week. Activities may include walking, swimming, or biking.  Include exercise to strengthen your muscles (resistance exercise), such as pilates or lifting weights, as part of your weekly exercise routine. Try to do these types of exercises for 30 minutes at least 3 days a week.  Do not use any products that contain nicotine or tobacco, such as cigarettes and e-cigarettes. If you need help quitting, ask your health care provider.  Monitor your blood pressure at home as told by your health care provider.  Keep all follow-up visits as told by your health care provider.  This is important. Medicines  Take over-the-counter and prescription medicines only as told by your health care provider. Follow directions carefully. Blood pressure medicines must be taken as prescribed.  Do not skip doses of blood pressure medicine. Doing this puts you at risk for problems and can make the medicine less effective.  Ask your health care provider about side effects or reactions to medicines that you should watch for. Contact a health care provider if:  You think you are having a reaction to a medicine you are taking.  You have headaches that keep coming back (recurring).  You feel dizzy.  You have swelling in your ankles.  You have trouble with your vision. Get help right away if:  You develop a severe headache or confusion.  You have unusual weakness or numbness.  You feel faint.  You have severe pain in your chest or abdomen.  You vomit  repeatedly.  You have trouble breathing. Summary  Hypertension is when the force of blood pumping through your arteries is too strong. If this condition is not controlled, it may put you at risk for serious complications.  Your personal target blood pressure may vary depending on your medical conditions, your age, and other factors. For most people, a normal blood pressure is less than 120/80.  Hypertension is treated with lifestyle changes, medicines, or a combination of both. Lifestyle changes include weight loss, eating a healthy, low-sodium diet, exercising more, and limiting alcohol. This information is not intended to replace advice given to you by your health care provider. Make sure you discuss any questions you have with your health care provider. Document Released: 06/23/2005 Document Revised: 05/21/2016 Document Reviewed: 05/21/2016 Elsevier Interactive Patient Education  [employer].

## 2018-06-22 NOTE — Assessment & Plan Note (Signed)
Blood pressure well controlled on medication.  No concerns.  Continue labetalol 100 mg twice a day.  Exercise and nutrition discussed with patient.  Follow-up in 6 months.

## 2018-06-22 NOTE — Progress Notes (Signed)
Kristen Madden 42 y.o.   Chief Complaint  Patient presents with  . Medication Refill    Labetalol HCI and blood pressure check   BP Readings from Last 3 Encounters:  06/22/18 138/83  04/28/18 (!) 145/82  01/08/18 124/80    HISTORY OF PRESENT ILLNESS: This is a 42 y.o. female with history of hypertension here for follow-up and medication refill.  Takes labetalol 100 mg twice a day.  Most recent lab work reviewed.  Madden 1 year ago with no concerns.  Patient has no complaints or medical concerns today.  Did not take morning medication today.  HPI   Prior to Admission medications   Medication Sig Start Date End Date Taking? Authorizing Provider  labetalol (NORMODYNE) 100 MG tablet Take 1 tablet (100 mg total) by mouth 2 (two) times daily. 06/08/18  Yes SagardiaInes Bloomer, MD    No Known Allergies  Patient Active Problem List   Diagnosis Date Noted  . Gallstone pancreatitis 05/14/2014  . HTN (hypertension) 06/28/2013    Past Medical History:  Diagnosis Date  . Hypertension   . Ovarian cyst     Past Surgical History:  Procedure Laterality Date  . CHOLECYSTECTOMY N/A 05/16/2014   Procedure: LAPAROSCOPIC CHOLECYSTECTOMY WITH INTRAOPERATIVE CHOLANGIOGRAM;  Surgeon: [name], MD;  Location: [facility];  Service: General;  Laterality: N/A;  . NO PAST SURGERIES      Social History   Socioeconomic History  . Marital status: Married    Spouse name: Not on file  . Number of children: Not on file  . Years of education: Not on file  . Highest education level: Not on file  Occupational History  . Not on file  Social Needs  . Financial resource strain: Not on file  . Food insecurity:    Worry: Not on file    Inability: Not on file  . Transportation needs:    Medical: Not on file    Non-medical: Not on file  Tobacco Use  . Smoking status: Never Smoker  . Smokeless tobacco: Never Used  Substance and Sexual Activity  . Alcohol use: No  . Drug use: No  .  Sexual activity: Yes    Birth control/protection: Condom  Lifestyle  . Physical activity:    Days per week: Not on file    Minutes per session: Not on file  . Stress: Not on file  Relationships  . Social connections:    Talks on phone: Not on file    Gets together: Not on file    Attends religious service: Not on file    Active member of club or organization: Not on file    Attends meetings of clubs or organizations: Not on file    Relationship status: Not on file  . Intimate partner violence:    Fear of current or ex partner: Not on file    Emotionally abused: Not on file    Physically abused: Not on file    Forced sexual activity: Not on file  Other Topics Concern  . Not on file  Social History Narrative  . Not on file    Family History  Problem Relation Age of Onset  . Hypertension Mother      Review of Systems  Constitutional: Negative.  Negative for chills and fever.  HENT: Negative.   Eyes: Negative.  Negative for blurred vision and double vision.  Respiratory: Negative.  Negative for cough, shortness of breath and wheezing.   Cardiovascular: Negative.  Negative for chest pain and palpitations.  Gastrointestinal: Negative.  Negative for abdominal pain, diarrhea, nausea and vomiting.  Genitourinary: Negative.  Negative for dysuria and hematuria.  Musculoskeletal: Negative.  Negative for back pain, myalgias and neck pain.  Skin: Negative.  Negative for rash.  Neurological: Negative.  Negative for dizziness and headaches.  Endo/Heme/Allergies: Negative.   All other systems reviewed and are negative.   Vitals:   06/22/18 1131  BP: 138/83  Pulse: 66  Resp: 16  Temp: 99.1 F (37.3 C)  SpO2: 98%    Physical Exam Vitals signs reviewed.  Constitutional:      Appearance: Normal appearance.  HENT:     Head: Normocephalic.  Eyes:     Extraocular Movements: Extraocular movements intact.     Conjunctiva/sclera: Conjunctivae normal.     Pupils: Pupils are  equal, round, and reactive to light.  Neck:     Musculoskeletal: Normal range of motion and neck supple.  Cardiovascular:     Rate and Rhythm: Normal rate and regular rhythm.     Heart sounds: Normal heart sounds.  Pulmonary:     Effort: Pulmonary effort is normal.     Breath sounds: Normal breath sounds.  Abdominal:     Tenderness: There is no abdominal tenderness.  Musculoskeletal: Normal range of motion.  Skin:    General: Skin is warm.     Capillary Refill: Capillary refill takes less than 2 seconds.  Neurological:     General: No focal deficit present.     Mental Status: She is alert and oriented to person, place, and time.     Sensory: No sensory deficit.     Motor: No weakness.     Coordination: Coordination normal.  Psychiatric:        Mood and Affect: Mood normal.        Behavior: Behavior normal.      ASSESSMENT & PLAN: HTN (hypertension) Blood pressure well controlled on medication.  No concerns.  Continue labetalol 100 mg twice a day.  Exercise and nutrition discussed with patient.  Follow-up in 6 months.   Calina was seen today for medication refill.  Diagnoses and all orders for this visit:  Essential hypertension -     labetalol (NORMODYNE) 100 MG tablet; Take 1 tablet (100 mg total) by mouth 2 (two) times daily.    Patient Instructions       If you have lab work Madden today you will be contacted with your lab results within the next 2 weeks.  If you have not heard from [place] then please contact us. The fastest way to get your results is to register for My Chart.   IF you received an x-ray today, you will receive an invoice from [facility]. Please contact [facility] at [phone] with questions or concerns regarding your invoice.   IF you received labwork today, you will receive an invoice from [facility]. Please contact [facility] at [phone] with questions or concerns regarding your invoice.   Our billing staff will not be able  to assist you with questions regarding bills from these companies.  You will be contacted with the lab results as soon as they are available. The fastest way to get your results is to activate your My Chart account. Instructions are located on the last page of this paperwork. If you have not heard from [place] regarding the results in 2 weeks, please contact this office.     Hypertension Hypertension, commonly called high blood pressure, is  when the force of blood pumping through the arteries is too strong. The arteries are the blood vessels that carry blood from the heart throughout the body. Hypertension forces the heart to work harder to pump blood and may cause arteries to become narrow or stiff. Having untreated or uncontrolled hypertension can cause heart attacks, strokes, kidney disease, and other problems. A blood pressure reading consists of a higher number over a lower number. Ideally, your blood pressure should be below 120/80. The first ("top") number is called the systolic pressure. It is a measure of the pressure in your arteries as your heart beats. The second ("bottom") number is called the diastolic pressure. It is a measure of the pressure in your arteries as the heart relaxes. What are the causes? The cause of this condition is not known. What increases the risk? Some risk factors for high blood pressure are under your control. Others are not. Factors you can change  Smoking.  Having type 2 diabetes mellitus, high cholesterol, or both.  Not getting enough exercise or physical activity.  Being overweight.  Having too much fat, sugar, calories, or salt (sodium) in your diet.  Drinking too much alcohol. Factors that are difficult or impossible to change  Having chronic kidney disease.  Having a family history of high blood pressure.  Age. Risk increases with age.  Race. You may be at higher risk if you are African-American.  Gender. Men are at higher risk than women  before age 48. After age 51, women are at higher risk than men.  Having obstructive sleep apnea.  Stress. What are the signs or symptoms? Extremely high blood pressure (hypertensive crisis) may cause:  Headache.  Anxiety.  Shortness of breath.  Nosebleed.  Nausea and vomiting.  Severe chest pain.  Jerky movements you cannot control (seizures).  How is this diagnosed? This condition is diagnosed by measuring your blood pressure while you are seated, with your arm resting on a surface. The cuff of the blood pressure monitor will be placed directly against the skin of your upper arm at the level of your heart. It should be measured at least twice using the same arm. Certain conditions can cause a difference in blood pressure between your right and left arms. Certain factors can cause blood pressure readings to be lower or higher than normal (elevated) for a short period of time:  When your blood pressure is higher when you are in a health care provider's office than when you are at home, this is called white coat hypertension. Most people with this condition do not need medicines.  When your blood pressure is higher at home than when you are in a health care provider's office, this is called masked hypertension. Most people with this condition may need medicines to control blood pressure.  If you have a high blood pressure reading during one visit or you have normal blood pressure with other risk factors:  You may be asked to return on a different day to have your blood pressure checked again.  You may be asked to monitor your blood pressure at home for 1 week or longer.  If you are diagnosed with hypertension, you may have other blood or imaging tests to help your health care provider understand your overall risk for other conditions. How is this treated? This condition is treated by making healthy lifestyle changes, such as eating healthy foods, exercising more, and reducing your  alcohol intake. Your health care provider may prescribe medicine if  lifestyle changes are not enough to get your blood pressure under control, and if:  Your systolic blood pressure is above 130.  Your diastolic blood pressure is above 80.  Your personal target blood pressure may vary depending on your medical conditions, your age, and other factors. Follow these instructions at home: Eating and drinking  Eat a diet that is high in fiber and potassium, and low in sodium, added sugar, and fat. An example eating plan is called the DASH (Dietary Approaches to Stop Hypertension) diet. To eat this way: ? Eat plenty of fresh fruits and vegetables. Try to fill half of your plate at each meal with fruits and vegetables. ? Eat whole grains, such as whole wheat pasta, brown rice, or whole grain bread. Fill about one quarter of your plate with whole grains. ? Eat or drink low-fat dairy products, such as skim milk or low-fat yogurt. ? Avoid fatty cuts of meat, processed or cured meats, and poultry with skin. Fill about one quarter of your plate with lean proteins, such as fish, chicken without skin, beans, eggs, and tofu. ? Avoid premade and processed foods. These tend to be higher in sodium, added sugar, and fat.  Reduce your daily sodium intake. Most people with hypertension should eat less than 1,500 mg of sodium a day.  Limit alcohol intake to no more than 1 drink a day for nonpregnant women and 2 drinks a day for men. One drink equals 12 oz of beer, 5 oz of wine, or 1 oz of hard liquor. Lifestyle  Work with your health care provider to maintain a healthy body weight or to lose weight. Ask what an ideal weight is for you.  Get at least 30 minutes of exercise that causes your heart to beat faster (aerobic exercise) most days of the week. Activities may include walking, swimming, or biking.  Include exercise to strengthen your muscles (resistance exercise), such as pilates or lifting weights, as part  of your weekly exercise routine. Try to do these types of exercises for 30 minutes at least 3 days a week.  Do not use any products that contain nicotine or tobacco, such as cigarettes and e-cigarettes. If you need help quitting, ask your health care provider.  Monitor your blood pressure at home as told by your health care provider.  Keep all follow-up visits as told by your health care provider. This is important. Medicines  Take over-the-counter and prescription medicines only as told by your health care provider. Follow directions carefully. Blood pressure medicines must be taken as prescribed.  Do not skip doses of blood pressure medicine. Doing this puts you at risk for problems and can make the medicine less effective.  Ask your health care provider about side effects or reactions to medicines that you should watch for. Contact a health care provider if:  You think you are having a reaction to a medicine you are taking.  You have headaches that keep coming back (recurring).  You feel dizzy.  You have swelling in your ankles.  You have trouble with your vision. Get help right away if:  You develop a severe headache or confusion.  You have unusual weakness or numbness.  You feel faint.  You have severe pain in your chest or abdomen.  You vomit repeatedly.  You have trouble breathing. Summary  Hypertension is when the force of blood pumping through your arteries is too strong. If this condition is not controlled, it may put you at risk for  serious complications.  Your personal target blood pressure may vary depending on your medical conditions, your age, and other factors. For most people, a normal blood pressure is less than 120/80.  Hypertension is treated with lifestyle changes, medicines, or a combination of both. Lifestyle changes include weight loss, eating a healthy, low-sodium diet, exercising more, and limiting alcohol. This information is not intended to  replace advice given to you by your health care provider. Make sure you discuss any questions you have with your health care provider. Document Released: 06/23/2005 Document Revised: 05/21/2016 Document Reviewed: 05/21/2016 Elsevier Interactive Patient Education  2018 Elsevier Inc.      Agustina Caroli, MD [facility]

## 2018-06-23 ENCOUNTER — Ambulatory Visit: Payer: Self-pay | Admitting: Obstetrics and Gynecology

## 2018-07-21 ENCOUNTER — Encounter: Payer: Self-pay | Admitting: Obstetrics & Gynecology

## 2018-07-21 ENCOUNTER — Ambulatory Visit (INDEPENDENT_AMBULATORY_CARE_PROVIDER_SITE_OTHER): Payer: Self-pay | Admitting: Obstetrics & Gynecology

## 2018-07-21 VITALS — BP 155/99 | HR 76 | Wt 236.7 lb

## 2018-07-21 DIAGNOSIS — R102 Pelvic and perineal pain: Secondary | ICD-10-CM

## 2018-07-21 DIAGNOSIS — E669 Obesity, unspecified: Secondary | ICD-10-CM | POA: Insufficient documentation

## 2018-07-21 DIAGNOSIS — N8003 Adenomyosis of the uterus: Secondary | ICD-10-CM

## 2018-07-21 DIAGNOSIS — N809 Endometriosis, unspecified: Secondary | ICD-10-CM

## 2018-07-21 DIAGNOSIS — Z789 Other specified health status: Secondary | ICD-10-CM

## 2018-07-21 NOTE — Progress Notes (Signed)
   Subjective:    Patient ID: Kristen Madden, female    DOB: [birth year], 43 y.o.   MRN: [id number]  HPI  43 yo married P2 Hispanic lady here with 6 month h/o pelvic pressure. Her u/s showed adenomyosis. She denies pain or dyspareunia.   Review of Systems [facility]. She uses condoms for contraception.  Her deliveries were vaginal.    Objective:   Physical Exam Breathing, conversing, and ambulating normally Well nourished, well hydrated Latina, no apparent distress  Live interpretor present for exam Abd- benign, obese      Assessment & Plan:  Adenomyosis with pelvic discomfort- offered trial of Liletta versus TVH.  Her BB is too high for me to prescribe OCPs.  She opts  versus watchful waiting I have suggested that she work on her BP at [facility] (she was seen there last month for this issue) Rec weight loss Rec IBU prn

## 2018-12-22 ENCOUNTER — Ambulatory Visit: Payer: Self-pay | Admitting: Emergency Medicine

## 2019-06-16 ENCOUNTER — Other Ambulatory Visit: Payer: Self-pay

## 2019-06-16 ENCOUNTER — Ambulatory Visit (INDEPENDENT_AMBULATORY_CARE_PROVIDER_SITE_OTHER): Payer: Self-pay | Admitting: Emergency Medicine

## 2019-06-16 ENCOUNTER — Encounter: Payer: Self-pay | Admitting: Emergency Medicine

## 2019-06-16 DIAGNOSIS — I10 Essential (primary) hypertension: Secondary | ICD-10-CM

## 2019-06-16 MED ORDER — LABETALOL HCL 100 MG PO TABS: 100.0000 mg | ORAL_TABLET | Freq: Two times a day (BID) | ORAL | 3 refills | Status: DC

## 2019-06-16 NOTE — Patient Instructions (Signed)
Hipertensin en los adultos Hypertension, Adult El trmino hipertensin es otra forma de denominar a la presin arterial elevada. La presin arterial elevada fuerza al corazn a trabajar ms para bombear la sangre. Esto puede causar problemas con el paso del [place]. Una lectura de presin arterial est compuesta por 2 nmeros. Hay un nmero superior (sistlico) sobre un nmero inferior (diastlico). Lo ideal es tener la presin arterial por debajo de 120/80. Las elecciones saludables pueden ayudar a Engineer, materials presin arterial, o tal vez necesite medicamentos para bajarla. Cules son las causas? Se desconoce la causa de esta afeccin. Algunas afecciones pueden estar relacionadas con la presin arterial alta. Qu incrementa el riesgo?  Fumar.  Tener diabetes mellitus tipo 2, colesterol alto, o ambos.  No hacer la cantidad suficiente de actividad fsica o ejercicio.  Tener sobrepeso.  Consumir mucha grasa, azcar, caloras o sal (sodio) en su dieta.  Beber alcohol en exceso.  Tener una enfermedad renal a largo plazo (crnica).  Tener antecedentes familiares de presin arterial alta.  Edad. Los riesgos aumentan con la edad.  Raza. El riesgo es mayor para las Retail banker.  Sexo. Antes de los 45aos, los hombres corren ms [employer]. Despus de los 65aos, las mujeres corren ms [employer].  Tener apnea obstructiva del sueo.  Estrs. Cules son los signos o los sntomas?  Es posible que la presin arterial alta puede no cause sntomas. La presin arterial muy alta (crisis hipertensiva) puede provocar: ? Dolor de [place]. ? Sensaciones de preocupacin o nerviosismo (ansiedad). ? Falta de aire. ? Hemorragia nasal. ? Sensacin de Engineer, site (nuseas). ? Vmitos. ? Cambios en la forma de ver. ? Dolor muy intenso en el pecho. ? Convulsiones. Cmo se trata?  Esta afeccin se trata haciendo cambios saludables en el estilo de  vida, por ejemplo: ? Consumir alimentos saludables. ? Hacer ms ejercicio. ? Beber menos alcohol.  El mdico puede recetarle medicamentos si los cambios en el estilo de vida no son suficientes para Child psychotherapist la presin arterial y si: ? El nmero de arriba est por encima de 130. ? El nmero de abajo est por encima de 80.  Su presin arterial personal ideal puede variar. Siga estas instrucciones en su casa: Comida y bebida   Si se lo dicen, siga el plan de alimentacin de DASH (Dietary Approaches to Stop Hypertension, Maneras de alimentarse para detener la hipertensin). Para seguir este plan: ? Llene la mitad del plato de cada comida con frutas y verduras. ? Llene un cuarto del plato de cada comida con cereales integrales. Los cereales integrales incluyen pasta integral, arroz integral y pan integral. ? Coma y beba productos lcteos con bajo contenido de grasa, como leche descremada o yogur bajo en grasas. ? Llene un cuarto del plato de cada comida con protenas bajas en grasa (magras). Las protenas bajas en grasa incluyen pescado, pollo sin piel, huevos, frijoles y tofu. ? Evite consumir carne grasa, carne curada y procesada, o pollo con piel. ? Evite consumir alimentos prehechos o procesados.  Consuma menos de 1500 mg de sal por da.  No beba alcohol si: ? El mdico le indica que no lo haga. ? Est embarazada, puede estar embarazada o est tratando de quedar embarazada.  Si bebe alcohol: ? Limite la cantidad que bebe a lo siguiente:  De 0 a 1 medida por da para las mujeres.  De 0 a 2 medidas por da para los hombres. ? Est atento a  la cantidad de alcohol que hay en las bebidas que toma. En los [place], una medida equivale a una botella de cerveza de 12oz (374m), un vaso de vino de 5oz (1423m o un vaso de una bebida alcohlica de alta graduacin de 1oz (4481m Estilo de vida   Trabaje con su mdico para mantenerse en un peso saludable o para perder  peso. Pregntele a su mdico cul es el peso recomendable para usted.  Haga al menos 81m68mos de ejercicio la mayoHartford Financialla semaFoothill Farmstos pueden incluir caminar, nadar o andar en bicicleta.  Realice al menos 30 minutos de ejercicio que fortalezca sus msculos (ejercicios de resistencia) al menos 3 das a la semaYodertos pueden incluir levantar pesas o hacer Pilates.  No consuma ningn producto que contenga nicotina o tabaco, como cigarrillos, cigarrillos electrnicos y tabaco de mascHigher education careers adviser necesita ayuda para dejar de fumar, consulte al mdicMeadWestvacoontrole su presin arterial en su casa tal como le indic el mdico.  Concurra a todas las visitas de seguimiento como se lo haya indicado el mdico. Esto es importante. Medicamentos  TomeDelphiventa libre y los recetados solamente como se lo haya indicado el mdico. Siga cuidadosamente las indicaciones.  No omita las dosis de medicamentos para la presin arterial. Los medicamentos pierden eficacia si omite dosis. El hecho de omitir las dosis tambin aumeSerbiariesgo de otros problemas.  Pregntele a su mdico a qu efectos secundarios o reacciones a los mediCareers information officermunquese con un mdico si:  Piensa que tiene una Mexicoccin a los medicamentos que est tomando.  Tiene dolores de cabeza frecuentes (recurrentes).  Se siente mareado.  Tiene hinchazn en los tobillos.  Tiene problemas de visin. Solicite ayuda inmediatamente si:  Siente un dolor de cabeza muy intenso.  Empieza a sentirse desorientado (confundido).  Se siente dbil o adormecido.  Siente que va a desmayarse.  Tiene un dolor muy intenso en las siguientes zonas: ? Pecho. ? Vientre (abdomen).  Vomita ms de una vez.  Tiene dificultad para respirar. Resumen  El trmino hipertensin es otra forma de denominar a la presin arterial elevada.  La presin arterial elevada fuerza al corazn a trabajar ms para bombear  la sangre.  Para la mayoComcasta presin arterial normal es menor que 120/80.  Las decisiones saludables pueden ayudarle a disminuir su presin arterial. Si no puede bajar su presin arterial mediante decisiones saludables, es posible que deba tomar medicamentos. Esta informacin no tiene comoMarine scientistconsejo del mdico. Asegrese de hacerle al mdico cualquier pregunta que tenga. Document Released: 12/11/2009 Document Revised: 04/08/2018 Document Reviewed: 04/08/2018 Elsevier Patient Education  2020 [employer]

## 2019-06-16 NOTE — Progress Notes (Signed)
Kristen Madden 43 y.o.   Chief Complaint  Patient presents with  . Hypertension    follow up    HISTORY OF PRESENT ILLNESS: This is a 43 y.o. female with history of hypertension here for follow-up and medication refill. BP Readings from Last 3 Encounters:  06/16/19 (!) 148/68  07/21/18 (!) 155/99  06/22/18 138/83  Has gained some weight.  Complaining of intermittent generalized weakness.  No other complaints or medical concerns. Wt Readings from Last 3 Encounters:  06/16/19 238 lb 12.8 oz (108.3 kg)  07/21/18 236 lb 11.2 oz (107.4 kg)  06/22/18 229 lb 6.4 oz (104.1 kg)      HPI   Prior to Admission medications   Medication Sig Start Date End Date Taking? Authorizing Provider  labetalol (NORMODYNE) 100 MG tablet Take 1 tablet (100 mg total) by mouth 2 (two) times daily. 06/22/18 09/20/18  Horald Pollen, MD    No Known Allergies  Patient Active Problem List   Diagnosis Date Noted  . Obesity 07/21/2018  . Adenomyosis 07/21/2018  . HTN (hypertension) 06/28/2013    Past Medical History:  Diagnosis Date  . Hypertension   . Ovarian cyst     Past Surgical History:  Procedure Laterality Date  . CHOLECYSTECTOMY N/A 05/16/2014   Procedure: LAPAROSCOPIC CHOLECYSTECTOMY WITH INTRAOPERATIVE CHOLANGIOGRAM;  Surgeon: [name], MD;  Location: [facility];  Service: General;  Laterality: N/A;  . NO PAST SURGERIES      Social History   Socioeconomic History  . Marital status: Married    Spouse name: Not on file  . Number of children: Not on file  . Years of education: Not on file  . Highest education level: Not on file  Occupational History  . Not on file  Tobacco Use  . Smoking status: Never Smoker  . Smokeless tobacco: Never Used  Substance and Sexual Activity  . Alcohol use: No  . Drug use: No  . Sexual activity: Yes    Birth control/protection: Condom  Other Topics Concern  . Not on file  Social History Narrative  . Not on file   Social  Determinants of Health   Financial Resource Strain:   . Difficulty of Paying Living Expenses: Not on file  Food Insecurity:   . Worried About Charity fundraiser in the Last Year: Not on file  . Ran Out of Food in the Last Year: Not on file  Transportation Needs:   . Lack of Transportation (Medical): Not on file  . Lack of Transportation (Non-Medical): Not on file  Physical Activity:   . Days of Exercise per Week: Not on file  . Minutes of Exercise per Session: Not on file  Stress:   . Feeling of Stress : Not on file  Social Connections:   . Frequency of Communication with Friends and Family: Not on file  . Frequency of Social Gatherings with Friends and Family: Not on file  . Attends Religious Services: Not on file  . Active Member of Clubs or Organizations: Not on file  . Attends Archivist Meetings: Not on file  . Marital Status: Not on file  Intimate Partner Violence:   . Fear of Current or Ex-Partner: Not on file  . Emotionally Abused: Not on file  . Physically Abused: Not on file  . Sexually Abused: Not on file    Family History  Problem Relation Age of Onset  . Hypertension Mother      Review of Systems  Constitutional: Negative.  Negative for chills and fever.  HENT: Negative.  Negative for congestion and sore throat.   Respiratory: Negative.  Negative for cough and shortness of breath.   Cardiovascular: Negative.  Negative for chest pain and palpitations.  Gastrointestinal: Negative.  Negative for abdominal pain, blood in stool, diarrhea, nausea and vomiting.  Genitourinary: Negative.  Negative for dysuria.  Musculoskeletal: Negative.  Negative for myalgias.  Skin: Negative.  Negative for rash.  Neurological: Negative for dizziness and headaches.  All other systems reviewed and are negative.  Vitals:   06/16/19 0813  BP: (!) 148/68  Pulse: 69  Resp: 16  Temp: 98.7 F (37.1 C)  SpO2: 97%     Physical Exam Vitals reviewed.  Constitutional:       Appearance: Normal appearance.  HENT:     Head: Normocephalic.  Eyes:     Extraocular Movements: Extraocular movements intact.     Conjunctiva/sclera: Conjunctivae normal.     Pupils: Pupils are equal, round, and reactive to light.  Cardiovascular:     Rate and Rhythm: Normal rate and regular rhythm.     Pulses: Normal pulses.     Heart sounds: Normal heart sounds.  Pulmonary:     Effort: Pulmonary effort is normal.     Breath sounds: Normal breath sounds.  Abdominal:     General: There is no distension.     Palpations: Abdomen is soft.     Tenderness: There is no abdominal tenderness.  Musculoskeletal:        General: Normal range of motion.     Cervical back: Normal range of motion and neck supple.  Skin:    General: Skin is warm and dry.  Neurological:     General: No focal deficit present.     Mental Status: She is alert and oriented to person, place, and time.      ASSESSMENT & PLAN: Kristen Madden was seen today for hypertension.  Diagnoses and all orders for this visit:  Essential hypertension -     labetalol (NORMODYNE) 100 MG tablet; Take 1 tablet (100 mg total) by mouth 2 (two) times daily. -     CBC with Differential -     Comprehensive metabolic panel -     Lipid panel -     Hemoglobin A1c -     TSH  This note is not being shared with the patient for the following reason: To respect privacy (The patient or proxy has requested that the information not be shared).   Patient Instructions  Hipertensin en los adultos Hypertension, Adult El trmino hipertensin es otra forma de denominar a la presin arterial elevada. La presin arterial elevada fuerza al corazn a trabajar ms para bombear la sangre. Esto puede causar problemas con el paso del [place]. Una lectura de presin arterial est compuesta por 2 nmeros. Hay un nmero superior (sistlico) sobre un nmero inferior (diastlico). Lo ideal es tener la presin arterial por debajo de 120/80. Las elecciones  saludables pueden ayudar a Engineer, materials presin arterial, o tal vez necesite medicamentos para bajarla. Cules son las causas? Se desconoce la causa de esta afeccin. Algunas afecciones pueden estar relacionadas con la presin arterial alta. Qu incrementa el riesgo?  Fumar.  Tener diabetes mellitus tipo 2, colesterol alto, o ambos.  No hacer la cantidad suficiente de actividad fsica o ejercicio.  Tener sobrepeso.  Consumir mucha grasa, azcar, caloras o sal (sodio) en su dieta.  Beber alcohol en exceso.  Tener una enfermedad renal  a largo plazo (crnica).  Tener antecedentes familiares de presin arterial alta.  Edad. Los riesgos aumentan con la edad.  Raza. El riesgo es mayor para las Retail banker.  Sexo. Antes de los 45aos, los hombres corren ms [employer]. Despus de los 65aos, las mujeres corren ms [employer].  Tener apnea obstructiva del sueo.  Estrs. Cules son los signos o los sntomas?  Es posible que la presin arterial alta puede no cause sntomas. La presin arterial muy alta (crisis hipertensiva) puede provocar: ? Dolor de [place]. ? Sensaciones de preocupacin o nerviosismo (ansiedad). ? Falta de aire. ? Hemorragia nasal. ? Sensacin de Engineer, site (nuseas). ? Vmitos. ? Cambios en la forma de ver. ? Dolor muy intenso en el pecho. ? Convulsiones. Cmo se trata?  Esta afeccin se trata haciendo cambios saludables en el estilo de vida, por ejemplo: ? Consumir alimentos saludables. ? Hacer ms ejercicio. ? Beber menos alcohol.  El mdico puede recetarle medicamentos si los cambios en el estilo de vida no son suficientes para Child psychotherapist la presin arterial y si: ? El nmero de arriba est por encima de 130. ? El nmero de abajo est por encima de 80.  Su presin arterial personal ideal puede variar. Siga estas instrucciones en su casa: Comida y bebida   Si se lo dicen, siga el plan de  alimentacin de DASH (Dietary Approaches to Stop Hypertension, Maneras de alimentarse para detener la hipertensin). Para seguir este plan: ? Llene la mitad del plato de cada comida con frutas y verduras. ? Llene un cuarto del plato de cada comida con cereales integrales. Los cereales integrales incluyen pasta integral, arroz integral y pan integral. ? Coma y beba productos lcteos con bajo contenido de grasa, como leche descremada o yogur bajo en grasas. ? Llene un cuarto del plato de cada comida con protenas bajas en grasa (magras). Las protenas bajas en grasa incluyen pescado, pollo sin piel, huevos, frijoles y tofu. ? Evite consumir carne grasa, carne curada y procesada, o pollo con piel. ? Evite consumir alimentos prehechos o procesados.  Consuma menos de 1500 mg de sal por da.  No beba alcohol si: ? El mdico le indica que no lo haga. ? Est embarazada, puede estar embarazada o est tratando de quedar embarazada.  Si bebe alcohol: ? Limite la cantidad que bebe a lo siguiente:  De 0 a 1 medida por da para las mujeres.  De 0 a 2 medidas por da para los hombres. ? Est atento a la cantidad de alcohol que hay en las bebidas que toma. En los [place], una medida equivale a una botella de cerveza de 12oz (328m), un vaso de vino de 5oz (1417m o un vaso de una bebida alcohlica de alta graduacin de 1oz (4455m Estilo de vida   Trabaje con su mdico para mantenerse en un peso saludable o para perder peso. Pregntele a su mdico cul es el peso recomendable para usted.  Haga al menos 51m18mos de ejercicio la mayoHartford Financialla semaLa Grangetos pueden incluir caminar, nadar o andar en bicicleta.  Realice al menos 30 minutos de ejercicio que fortalezca sus msculos (ejercicios de resistencia) al menos 3 das a la semaOakwoodtos pueden incluir levantar pesas o hacer Pilates.  No consuma ningn producto que contenga nicotina o tabaco, como cigarrillos, cigarrillos  electrnicos y tabaco de mascHigher education careers adviser necesita ayuda para dejar de fumar, consulte al mdico.  Controle su presin  arterial en su casa tal como le indic el mdico.  Concurra a todas las visitas de seguimiento como se lo haya indicado el mdico. Esto es importante. Medicamentos  [employer] de venta libre y los recetados solamente como se lo haya indicado el mdico. Siga cuidadosamente las indicaciones.  No omita las dosis de medicamentos para la presin arterial. Los medicamentos pierden eficacia si omite dosis. El hecho de omitir las dosis tambin [place] el riesgo de otros problemas.  Pregntele a su mdico a qu efectos secundarios o reacciones a los Careers information officer. Comunquese con un mdico si:  Piensa que tiene [place] reaccin a los medicamentos que est tomando.  Tiene dolores de cabeza frecuentes (recurrentes).  Se siente mareado.  Tiene hinchazn en los tobillos.  Tiene problemas de visin. Solicite ayuda inmediatamente si:  Siente un dolor de cabeza muy intenso.  Empieza a sentirse desorientado (confundido).  Se siente dbil o adormecido.  Siente que va a desmayarse.  Tiene un dolor muy intenso en las siguientes zonas: ? Pecho. ? Vientre (abdomen).  Vomita ms de una vez.  Tiene dificultad para respirar. Resumen  El trmino hipertensin es otra forma de denominar a la presin arterial elevada.  La presin arterial elevada fuerza al corazn a trabajar ms para bombear la sangre.  Para la [employer], una presin arterial normal es menor que 120/80.  Las decisiones saludables pueden ayudarle a disminuir su presin arterial. Si no puede bajar su presin arterial mediante decisiones saludables, es posible que deba tomar medicamentos. Esta informacin no tiene Marine scientist el consejo del mdico. Asegrese de hacerle al mdico cualquier pregunta que tenga. Document Released: 12/11/2009 Document Revised: 04/08/2018  Document Reviewed: 04/08/2018 Elsevier Patient Education  2020 Elsevier Inc.      Agustina Caroli, MD [facility]

## 2019-06-17 LAB — CBC WITH DIFFERENTIAL/PLATELET
Basophils Absolute: 0.1 10*3/uL (ref 0.0–0.2)
Basos: 1 %
EOS (ABSOLUTE): 0.3 10*3/uL (ref 0.0–0.4)
Eos: 3 %
Hematocrit: 39.8 % (ref 34.0–46.6)
Hemoglobin: 13.2 g/dL (ref 11.1–15.9)
Immature Grans (Abs): 0 10*3/uL (ref 0.0–0.1)
Immature Granulocytes: 0 %
Lymphocytes Absolute: 1.6 10*3/uL (ref 0.7–3.1)
Lymphs: 18 %
MCH: 30.4 pg (ref 26.6–33.0)
MCHC: 33.2 g/dL (ref 31.5–35.7)
MCV: 92 fL (ref 79–97)
Monocytes Absolute: 0.6 10*3/uL (ref 0.1–0.9)
Monocytes: 7 %
Neutrophils Absolute: 6.2 10*3/uL (ref 1.4–7.0)
Neutrophils: 71 %
Platelets: 287 10*3/uL (ref 150–450)
RBC: 4.34 x10E6/uL (ref 3.77–5.28)
RDW: 12.9 % (ref 11.7–15.4)
WBC: 8.8 10*3/uL (ref 3.4–10.8)

## 2019-06-17 LAB — COMPREHENSIVE METABOLIC PANEL
ALT: 40 IU/L — ABNORMAL HIGH (ref 0–32)
AST: 30 IU/L (ref 0–40)
Albumin/Globulin Ratio: 1.9 (ref 1.2–2.2)
Albumin: 4.5 g/dL (ref 3.8–4.8)
Alkaline Phosphatase: 75 IU/L (ref 39–117)
BUN/Creatinine Ratio: 13 (ref 9–23)
BUN: 9 mg/dL (ref 6–24)
Bilirubin Total: 1.1 mg/dL (ref 0.0–1.2)
CO2: 20 mmol/L (ref 20–29)
Calcium: 9.4 mg/dL (ref 8.7–10.2)
Chloride: 102 mmol/L (ref 96–106)
Creatinine, Ser: 0.69 mg/dL (ref 0.57–1.00)
GFR calc Af Amer: 123 mL/min/{1.73_m2} (ref >59)
GFR calc non Af Amer: 107 mL/min/{1.73_m2} (ref >59)
Globulin, Total: 2.4 g/dL (ref 1.5–4.5)
Glucose: 96 mg/dL (ref 65–99)
Potassium: 4.4 mmol/L (ref 3.5–5.2)
Sodium: 137 mmol/L (ref 134–144)
Total Protein: 6.9 g/dL (ref 6.0–8.5)

## 2019-06-17 LAB — LIPID PANEL
Chol/HDL Ratio: 3.2 ratio (ref 0.0–4.4)
Cholesterol, Total: 149 mg/dL (ref 100–199)
HDL: 47 mg/dL (ref >39)
LDL Chol Calc (NIH): 83 mg/dL (ref 0–99)
Triglycerides: 101 mg/dL (ref 0–149)
VLDL Cholesterol Cal: 19 mg/dL (ref 5–40)

## 2019-06-17 LAB — HEMOGLOBIN A1C
Est. average glucose Bld gHb Est-mCnc: 117 mg/dL
Hgb A1c MFr Bld: 5.7 % — ABNORMAL HIGH (ref 4.8–5.6)

## 2019-06-17 LAB — TSH: TSH: 4.16 u[IU]/mL (ref 0.450–4.500)

## 2020-06-14 ENCOUNTER — Encounter: Payer: Self-pay | Admitting: Emergency Medicine

## 2020-06-14 ENCOUNTER — Ambulatory Visit (INDEPENDENT_AMBULATORY_CARE_PROVIDER_SITE_OTHER): Payer: Self-pay | Admitting: Emergency Medicine

## 2020-06-14 ENCOUNTER — Other Ambulatory Visit: Payer: Self-pay

## 2020-06-14 VITALS — BP 130/80 | HR 76 | Temp 98.1°F | Ht 63.0 in | Wt 242.0 lb

## 2020-06-14 DIAGNOSIS — Z6841 Body Mass Index (BMI) 40.0 and over, adult: Secondary | ICD-10-CM

## 2020-06-14 DIAGNOSIS — I10 Essential (primary) hypertension: Secondary | ICD-10-CM

## 2020-06-14 LAB — COMPREHENSIVE METABOLIC PANEL
ALT: 47 IU/L — ABNORMAL HIGH (ref 0–32)
AST: 29 IU/L (ref 0–40)
Albumin/Globulin Ratio: 1.7 (ref 1.2–2.2)
Albumin: 4.5 g/dL (ref 3.8–4.8)
Alkaline Phosphatase: 82 IU/L (ref 44–121)
BUN/Creatinine Ratio: 19 (ref 9–23)
BUN: 12 mg/dL (ref 6–24)
Bilirubin Total: 1.1 mg/dL (ref 0.0–1.2)
CO2: 20 mmol/L (ref 20–29)
Calcium: 9.5 mg/dL (ref 8.7–10.2)
Chloride: 100 mmol/L (ref 96–106)
Creatinine, Ser: 0.63 mg/dL (ref 0.57–1.00)
GFR calc Af Amer: 126 mL/min/{1.73_m2} (ref >59)
GFR calc non Af Amer: 109 mL/min/{1.73_m2} (ref >59)
Globulin, Total: 2.7 g/dL (ref 1.5–4.5)
Glucose: 107 mg/dL — ABNORMAL HIGH (ref 65–99)
Potassium: 4.3 mmol/L (ref 3.5–5.2)
Sodium: 137 mmol/L (ref 134–144)
Total Protein: 7.2 g/dL (ref 6.0–8.5)

## 2020-06-14 LAB — HEMOGLOBIN A1C
Est. average glucose Bld gHb Est-mCnc: 117 mg/dL
Hgb A1c MFr Bld: 5.7 % — ABNORMAL HIGH (ref 4.8–5.6)

## 2020-06-14 LAB — CBC WITH DIFFERENTIAL/PLATELET
Basophils Absolute: 0.1 10*3/uL (ref 0.0–0.2)
Basos: 1 %
EOS (ABSOLUTE): 0.3 10*3/uL (ref 0.0–0.4)
Eos: 3 %
Hematocrit: 40 % (ref 34.0–46.6)
Hemoglobin: 13.5 g/dL (ref 11.1–15.9)
Immature Grans (Abs): 0 10*3/uL (ref 0.0–0.1)
Immature Granulocytes: 0 %
Lymphocytes Absolute: 2 10*3/uL (ref 0.7–3.1)
Lymphs: 22 %
MCH: 29.5 pg (ref 26.6–33.0)
MCHC: 33.8 g/dL (ref 31.5–35.7)
MCV: 87 fL (ref 79–97)
Monocytes Absolute: 0.5 10*3/uL (ref 0.1–0.9)
Monocytes: 5 %
Neutrophils Absolute: 6.2 10*3/uL (ref 1.4–7.0)
Neutrophils: 69 %
Platelets: 298 10*3/uL (ref 150–450)
RBC: 4.58 x10E6/uL (ref 3.77–5.28)
RDW: 13 % (ref 11.7–15.4)
WBC: 9 10*3/uL (ref 3.4–10.8)

## 2020-06-14 LAB — LIPID PANEL
Chol/HDL Ratio: 3.1 ratio (ref 0.0–4.4)
Cholesterol, Total: 153 mg/dL (ref 100–199)
HDL: 50 mg/dL (ref >39)
LDL Chol Calc (NIH): 87 mg/dL (ref 0–99)
Triglycerides: 84 mg/dL (ref 0–149)
VLDL Cholesterol Cal: 16 mg/dL (ref 5–40)

## 2020-06-14 MED ORDER — LABETALOL HCL 100 MG PO TABS: 100.0000 mg | ORAL_TABLET | Freq: Two times a day (BID) | ORAL | 3 refills | Status: DC

## 2020-06-14 NOTE — Patient Instructions (Addendum)
If you have lab work done today you will be contacted with your lab results within the next 2 weeks.  If you have not heard from [place] then please contact us. The fastest way to get your results is to register for My Chart.   IF you received an x-ray today, you will receive an invoice from [facility]. Please contact [facility] at [phone] with questions or concerns regarding your invoice.   IF you received labwork today, you will receive an invoice from [facility]. Please contact [facility] at [phone] with questions or concerns regarding your invoice.   Our billing staff will not be able to assist you with questions regarding bills from these companies.  You will be contacted with the lab results as soon as they are available. The fastest way to get your results is to activate your My Chart account. Instructions are located on the last page of this paperwork. If you have not heard from [place] regarding the results in 2 weeks, please contact this office.      Hipertensin en los adultos Hypertension, Adult El trmino hipertensin es otra forma de denominar a la presin arterial elevada. La presin arterial elevada fuerza al corazn a trabajar ms para bombear la sangre. Esto puede causar problemas con el paso del [place]. Una lectura de presin arterial est compuesta por 2 nmeros. Hay un nmero superior (sistlico) sobre un nmero inferior (diastlico). Lo ideal es tener la presin arterial por debajo de 120/80. Las elecciones saludables pueden ayudar a Engineer, materials presin arterial, o tal vez necesite medicamentos para bajarla. Cules son las causas? Se desconoce la causa de esta afeccin. Algunas afecciones pueden estar relacionadas con la presin arterial alta. Qu incrementa el riesgo?  Fumar.  Tener diabetes mellitus tipo 2, colesterol alto, o ambos.  No hacer la cantidad suficiente de actividad fsica o ejercicio.  Tener sobrepeso.  Consumir mucha grasa,  azcar, caloras o sal (sodio) en su dieta.  Beber alcohol en exceso.  Tener una enfermedad renal a largo plazo (crnica).  Tener antecedentes familiares de presin arterial alta.  Edad. Los riesgos aumentan con la edad.  Raza. El riesgo es mayor para las Retail banker.  Sexo. Antes de los 45aos, los hombres corren ms [employer]. Despus de los 65aos, las mujeres corren ms [employer].  Tener apnea obstructiva del sueo.  Estrs. Cules son los signos o los sntomas?  Es posible que la presin arterial alta puede no cause sntomas. La presin arterial muy alta (crisis hipertensiva) puede provocar: ? Dolor de [place]. ? Sensaciones de preocupacin o nerviosismo (ansiedad). ? Falta de aire. ? Hemorragia nasal. ? Sensacin de Engineer, site (nuseas). ? Vmitos. ? Cambios en la forma de ver. ? Dolor muy intenso en el pecho. ? Convulsiones. Cmo se trata?  Esta afeccin se trata haciendo cambios saludables en el estilo de vida, por ejemplo: ? Consumir alimentos saludables. ? Hacer ms ejercicio. ? Beber menos alcohol.  El mdico puede recetarle medicamentos si los cambios en el estilo de vida no son suficientes para Child psychotherapist la presin arterial y si: ? El nmero de arriba est por encima de 130. ? El nmero de abajo est por encima de 80.  Su presin arterial personal ideal puede variar. Siga estas instrucciones en su casa: Comida y bebida   Si se lo dicen, siga el plan de alimentacin de DASH (Dietary Approaches to Stop Hypertension, Maneras de alimentarse para detener la hipertensin).  Para seguir este plan: ? Llene la mitad del plato de cada comida con frutas y verduras. ? Llene un cuarto del plato de cada comida con cereales integrales. Los cereales integrales incluyen pasta integral, arroz integral y pan integral. ? Coma y beba productos lcteos con bajo contenido de grasa, como leche descremada o yogur bajo en  grasas. ? Llene un cuarto del plato de cada comida con protenas bajas en grasa (magras). Las protenas bajas en grasa incluyen pescado, pollo sin piel, huevos, frijoles y tofu. ? Evite consumir carne grasa, carne curada y procesada, o pollo con piel. ? Evite consumir alimentos prehechos o procesados.  Consuma menos de 1500 mg de sal por da.  No beba alcohol si: ? El mdico le indica que no lo haga. ? Est embarazada, puede estar embarazada o est tratando de quedar embarazada.  Si bebe alcohol: ? Limite la cantidad que bebe a lo siguiente:  De 0 a 1 medida por da para las mujeres.  De 0 a 2 medidas por da para los hombres. ? Est atento a la cantidad de alcohol que hay en las bebidas que toma. En los [place], una medida equivale a una botella de cerveza de 12oz (332m), un vaso de vino de 5oz (1469m o un vaso de una bebida alcohlica de alta graduacin de 1oz (4456m Estilo de vida   Trabaje con su mdico para mantenerse en un peso saludable o para perder peso. Pregntele a su mdico cul es el peso recomendable para usted.  Haga al menos 66m5mos de ejercicio la mayoHartford Financialla semaLogantos pueden incluir caminar, nadar o andar en bicicleta.  Realice al menos 30 minutos de ejercicio que fortalezca sus msculos (ejercicios de resistencia) al menos 3 das a la semaRockford Baytos pueden incluir levantar pesas o hacer Pilates.  No consuma ningn producto que contenga nicotina o tabaco, como cigarrillos, cigarrillos electrnicos y tabaco de mascHigher education careers adviser necesita ayuda para dejar de fumar, consulte al mdicMeadWestvacoontrole su presin arterial en su casa tal como le indic el mdico.  Concurra a todas las visitas de seguimiento como se lo haya indicado el mdico. Esto es importante. Medicamentos  TomeDelphiventa libre y los recetados solamente como se lo haya indicado el mdico. Siga cuidadosamente las indicaciones.  No omita las dosis de medicamentos  para la presin arterial. Los medicamentos pierden eficacia si omite dosis. El hecho de omitir las dosis tambin aumeSerbiariesgo de otros problemas.  Pregntele a su mdico a qu efectos secundarios o reacciones a los mediCareers information officermunquese con un mdico si:  Piensa que tiene una [place] a los medicamentos que est tomando.  Tiene dolores de cabeza frecuentes (recurrentes).  Se siente mareado.  Tiene hinchazn en los tobillos.  Tiene problemas de visin. Solicite ayuda inmediatamente si:  Siente un dolor de cabeza muy intenso.  Empieza a sentirse desorientado (confundido).  Se siente dbil o adormecido.  Siente que va a desmayarse.  Tiene un dolor muy intenso en las siguientes zonas: ? Pecho. ? Vientre (abdomen).  Vomita ms de una vez.  Tiene dificultad para respirar. Resumen  El trmino hipertensin es otra forma de denominar a la presin arterial elevada.  La presin arterial elevada fuerza al corazn a trabajar ms para bombear la sangre.  Para la mayoComcasta presin arterial normal es menor que 120/80.  Las decisiones saludables pueden ayudarle a disminuir su presin arterial. Si no puede  bajar su presin arterial mediante decisiones saludables, es posible que deba tomar medicamentos. Esta informacin no tiene Marine scientist el consejo del mdico. Asegrese de hacerle al mdico cualquier pregunta que tenga. Document Revised: 04/08/2018 Document Reviewed: 04/08/2018 Elsevier Patient Education  [place].

## 2020-06-14 NOTE — Progress Notes (Signed)
Kristen Madden 44 y.o.   Chief Complaint  Patient presents with  . Hypertension    Patient states her bp has been running normal at home    HISTORY OF PRESENT ILLNESS: This is a 44 y.o. female with history of hypertension on labetalol 100 mg twice a day for least 7 years.  Here for follow-up and medication refill. Has no complaints or medical concerns. Fully vaccinated against Covid.  HPI   Prior to Admission medications   Medication Sig Start Date End Date Taking? Authorizing Provider  labetalol (NORMODYNE) 100 MG tablet Take 1 tablet (100 mg total) by mouth 2 (two) times daily. 06/16/19 09/14/19  Horald Pollen, MD    No Known Allergies  Patient Active Problem List   Diagnosis Date Noted  . Obesity 07/21/2018  . Adenomyosis 07/21/2018  . HTN (hypertension) 06/28/2013    Past Medical History:  Diagnosis Date  . Hypertension   . Ovarian cyst     Past Surgical History:  Procedure Laterality Date  . CHOLECYSTECTOMY N/A 05/16/2014   Procedure: LAPAROSCOPIC CHOLECYSTECTOMY WITH INTRAOPERATIVE CHOLANGIOGRAM;  Surgeon: [name], MD;  Location: [facility];  Service: General;  Laterality: N/A;  . NO PAST SURGERIES      Social History   Socioeconomic History  . Marital status: Married    Spouse name: Not on file  . Number of children: Not on file  . Years of education: Not on file  . Highest education level: Not on file  Occupational History  . Not on file  Tobacco Use  . Smoking status: Never Smoker  . Smokeless tobacco: Never Used  Vaping Use  . Vaping Use: Never used  Substance and Sexual Activity  . Alcohol use: No  . Drug use: No  . Sexual activity: Yes    Birth control/protection: Condom  Other Topics Concern  . Not on file  Social History Narrative  . Not on file   Social Determinants of Health   Financial Resource Strain: Not on file  Food Insecurity: Not on file  Transportation Needs: Not on file  Physical Activity: Not on file   Stress: Not on file  Social Connections: Not on file  Intimate Partner Violence: Not on file    Family History  Problem Relation Age of Onset  . Hypertension Mother      Review of Systems  Constitutional: Negative.  Negative for chills and fever.  HENT: Negative.  Negative for congestion and sore throat.   Respiratory: Negative.  Negative for cough and shortness of breath.   Cardiovascular: Negative.  Negative for chest pain and palpitations.  Gastrointestinal: Negative.  Negative for abdominal pain, blood in stool, diarrhea, melena, nausea and vomiting.  Genitourinary: Negative.  Negative for dysuria and hematuria.  Musculoskeletal: Negative.  Negative for back pain, myalgias and neck pain.  Skin: Negative.  Negative for rash.  Neurological: Negative.  Negative for dizziness and headaches.  All other systems reviewed and are negative.   Today's Vitals   06/14/20 0817  BP: (!) 157/84  Pulse: 76  Temp: 98.1 F (36.7 C)  TempSrc: Temporal  SpO2: 98%  Weight: 242 lb (109.8 kg)  Height: 5' 3"  (1.6 m)   Body mass index is 42.87 kg/m. Wt Readings from Last 3 Encounters:  06/14/20 242 lb (109.8 kg)  06/16/19 238 lb 12.8 oz (108.3 kg)  07/21/18 236 lb 11.2 oz (107.4 kg)    Physical Exam Vitals reviewed.  Constitutional:      Appearance:  Normal appearance.  HENT:     Head: Normocephalic.     Mouth/Throat:     Mouth: Mucous membranes are moist.     Pharynx: Oropharynx is clear.  Eyes:     Extraocular Movements: Extraocular movements intact.     Conjunctiva/sclera: Conjunctivae normal.     Pupils: Pupils are equal, round, and reactive to light.  Cardiovascular:     Rate and Rhythm: Normal rate and regular rhythm.     Pulses: Normal pulses.     Heart sounds: Normal heart sounds.  Pulmonary:     Effort: Pulmonary effort is normal.     Breath sounds: Normal breath sounds.  Musculoskeletal:        General: Normal range of motion.     Cervical back: Normal range of  motion and neck supple.  Skin:    General: Skin is warm and dry.     Capillary Refill: Capillary refill takes less than 2 seconds.  Neurological:     General: No focal deficit present.     Mental Status: She is alert and oriented to person, place, and time.  Psychiatric:        Mood and Affect: Mood normal.        Behavior: Behavior normal.     A total of 30 minutes was spent with the patient, greater than 50% of which was in counseling/coordination of care regarding hypertension and cardiovascular risks associated with this condition, review of medication, review of most recent blood work results, review of most recent office visit notes, education on nutrition, health maintenance items, prognosis, documentation, need for follow-up.   ASSESSMENT & PLAN: HTN (hypertension) Well-controlled hypertension with normal blood pressure readings at home. Continue labetalol 100 mg twice a day. Diet and nutrition discussed. Follow-up in 6 months.  Kristen Madden was seen today for hypertension.  Diagnoses and all orders for this visit:  Essential hypertension -     labetalol (NORMODYNE) 100 MG tablet; Take 1 tablet (100 mg total) by mouth 2 (two) times daily. -     Comprehensive metabolic panel -     Lipid panel -     CBC with Differential/Platelet -     Hemoglobin A1c  Body mass index (BMI) of 40.1-44.9 in adult [facility])    Patient Instructions       If you have lab work done today you will be contacted with your lab results within the next 2 weeks.  If you have not heard from [place] then please contact us. The fastest way to get your results is to register for My Chart.   IF you received an x-ray today, you will receive an invoice from [facility]. Please contact [facility] at [phone] with questions or concerns regarding your invoice.   IF you received labwork today, you will receive an invoice from [facility]. Please contact [facility] at [phone] with questions or  concerns regarding your invoice.   Our billing staff will not be able to assist you with questions regarding bills from these companies.  You will be contacted with the lab results as soon as they are available. The fastest way to get your results is to activate your My Chart account. Instructions are located on the last page of this paperwork. If you have not heard from [place] regarding the results in 2 weeks, please contact this office.      Hipertensin en los adultos Hypertension, Adult El trmino hipertensin es otra forma de denominar a la presin arterial elevada. La  presin arterial elevada fuerza al corazn a trabajar ms para bombear la sangre. Esto puede causar problemas con el paso del [place]. Una lectura de presin arterial est compuesta por 2 nmeros. Hay un nmero superior (sistlico) sobre un nmero inferior (diastlico). Lo ideal es tener la presin arterial por debajo de 120/80. Las elecciones saludables pueden ayudar a Engineer, materials presin arterial, o tal vez necesite medicamentos para bajarla. Cules son las causas? Se desconoce la causa de esta afeccin. Algunas afecciones pueden estar relacionadas con la presin arterial alta. Qu incrementa el riesgo?  Fumar.  Tener diabetes mellitus tipo 2, colesterol alto, o ambos.  No hacer la cantidad suficiente de actividad fsica o ejercicio.  Tener sobrepeso.  Consumir mucha grasa, azcar, caloras o sal (sodio) en su dieta.  Beber alcohol en exceso.  Tener una enfermedad renal a largo plazo (crnica).  Tener antecedentes familiares de presin arterial alta.  Edad. Los riesgos aumentan con la edad.  Raza. El riesgo es mayor para las Retail banker.  Sexo. Antes de los 45aos, los hombres corren ms [employer]. Despus de los 65aos, las mujeres corren ms [employer].  Tener apnea obstructiva del sueo.  Estrs. Cules son los signos o los sntomas?  Es posible que la presin  arterial alta puede no cause sntomas. La presin arterial muy alta (crisis hipertensiva) puede provocar: ? Dolor de [place]. ? Sensaciones de preocupacin o nerviosismo (ansiedad). ? Falta de aire. ? Hemorragia nasal. ? Sensacin de Engineer, site (nuseas). ? Vmitos. ? Cambios en la forma de ver. ? Dolor muy intenso en el pecho. ? Convulsiones. Cmo se trata?  Esta afeccin se trata haciendo cambios saludables en el estilo de vida, por ejemplo: ? Consumir alimentos saludables. ? Hacer ms ejercicio. ? Beber menos alcohol.  El mdico puede recetarle medicamentos si los cambios en el estilo de vida no son suficientes para Child psychotherapist la presin arterial y si: ? El nmero de arriba est por encima de 130. ? El nmero de abajo est por encima de 80.  Su presin arterial personal ideal puede variar. Siga estas instrucciones en su casa: Comida y bebida   Si se lo dicen, siga el plan de alimentacin de DASH (Dietary Approaches to Stop Hypertension, Maneras de alimentarse para detener la hipertensin). Para seguir este plan: ? Llene la mitad del plato de cada comida con frutas y verduras. ? Llene un cuarto del plato de cada comida con cereales integrales. Los cereales integrales incluyen pasta integral, arroz integral y pan integral. ? Coma y beba productos lcteos con bajo contenido de grasa, como leche descremada o yogur bajo en grasas. ? Llene un cuarto del plato de cada comida con protenas bajas en grasa (magras). Las protenas bajas en grasa incluyen pescado, pollo sin piel, huevos, frijoles y tofu. ? Evite consumir carne grasa, carne curada y procesada, o pollo con piel. ? Evite consumir alimentos prehechos o procesados.  Consuma menos de 1500 mg de sal por da.  No beba alcohol si: ? El mdico le indica que no lo haga. ? Est embarazada, puede estar embarazada o est tratando de quedar embarazada.  Si bebe alcohol: ? Limite la cantidad que bebe a lo  siguiente:  De 0 a 1 medida por da para las mujeres.  De 0 a 2 medidas por da para los hombres. ? Est atento a la cantidad de alcohol que hay en las bebidas que toma. En los [place], una medida [place] a  una botella de cerveza de 12oz (376m), un vaso de vino de 5oz (1464m o un vaso de una bebida alcohlica de alta graduacin de 1oz (4454m Estilo de vida   Trabaje con su mdico para mantenerse en un peso saludable o para perder peso. Pregntele a su mdico cul es el peso recomendable para usted.  Haga al menos 108m23mos de ejercicio la mayoHartford Financialla semaJenisontos pueden incluir caminar, nadar o andar en bicicleta.  Realice al menos 30 minutos de ejercicio que fortalezca sus msculos (ejercicios de resistencia) al menos 3 das a la semaThe Ranchtos pueden incluir levantar pesas o hacer [place].  No consuma ningn producto que contenga nicotina o tabaco, como cigarrillos, cigarrillos electrnicos y tabaco de mascHigher education careers adviser necesita ayuda para dejar de fumar, consulte al mdicMeadWestvacoontrole su presin arterial en su casa tal como le indic el mdico.  Concurra a todas las visitas de seguimiento como se lo haya indicado el mdico. Esto es importante. Medicamentos  TomeDelphiventa libre y los recetados solamente como se lo haya indicado el mdico. Siga cuidadosamente las indicaciones.  No omita las dosis de medicamentos para la presin arterial. Los medicamentos pierden eficacia si omite dosis. El hecho de omitir las dosis tambin aumeSerbiariesgo de otros problemas.  Pregntele a su mdico a qu efectos secundarios o reacciones a los mediCareers information officermunquese con un mdico si:  Piensa que tiene una Mexicoccin a los medicamentos que est tomando.  Tiene dolores de cabeza frecuentes (recurrentes).  Se siente mareado.  Tiene hinchazn en los tobillos.  Tiene problemas de visin. Solicite ayuda inmediatamente si:  Siente un  dolor de cabeza muy intenso.  Empieza a sentirse desorientado (confundido).  Se siente dbil o adormecido.  Siente que va a desmayarse.  Tiene un dolor muy intenso en las siguientes zonas: ? Pecho. ? Vientre (abdomen).  Vomita ms de una vez.  Tiene dificultad para respirar. Resumen  El trmino hipertensin es otra forma de denominar a la presin arterial elevada.  La presin arterial elevada fuerza al corazn a trabajar ms para bombear la sangre.  Para la mayoComcasta presin arterial normal es menor que 120/80.  Las decisiones saludables pueden ayudarle a disminuir su presin arterial. Si no puede bajar su presin arterial mediante decisiones saludables, es posible que deba tomar medicamentos. Esta informacin no tiene comoMarine scientistconsejo del mdico. Asegrese de hacerle al mdico cualquier pregunta que tenga. Document Revised: 04/08/2018 Document Reviewed: 04/08/2018 Elsevier Patient Education  2020 Elsevier Inc.      MiguAgustina Caroli [facility]

## 2020-06-14 NOTE — Assessment & Plan Note (Signed)
Well-controlled hypertension with normal blood pressure readings at home. Continue labetalol 100 mg twice a day. Diet and nutrition discussed. Follow-up in 6 months.

## 2020-06-16 ENCOUNTER — Other Ambulatory Visit: Payer: Self-pay | Admitting: Emergency Medicine

## 2020-12-13 ENCOUNTER — Ambulatory Visit: Payer: Self-pay | Admitting: Emergency Medicine

## 2021-06-05 ENCOUNTER — Ambulatory Visit (INDEPENDENT_AMBULATORY_CARE_PROVIDER_SITE_OTHER): Payer: Self-pay | Admitting: Emergency Medicine

## 2021-06-05 ENCOUNTER — Encounter: Payer: Self-pay | Admitting: Emergency Medicine

## 2021-06-05 ENCOUNTER — Other Ambulatory Visit: Payer: Self-pay

## 2021-06-05 ENCOUNTER — Telehealth: Payer: Self-pay | Admitting: Emergency Medicine

## 2021-06-05 VITALS — BP 148/82 | HR 79 | Ht 63.0 in | Wt 247.0 lb

## 2021-06-05 DIAGNOSIS — Z1211 Encounter for screening for malignant neoplasm of colon: Secondary | ICD-10-CM

## 2021-06-05 DIAGNOSIS — Z6841 Body Mass Index (BMI) 40.0 and over, adult: Secondary | ICD-10-CM | POA: Insufficient documentation

## 2021-06-05 DIAGNOSIS — I10 Essential (primary) hypertension: Secondary | ICD-10-CM

## 2021-06-05 LAB — CBC WITH DIFFERENTIAL/PLATELET
Basophils Absolute: 0.1 10*3/uL (ref 0.0–0.1)
Basophils Relative: 0.9 % (ref 0.0–3.0)
Eosinophils Absolute: 0.2 10*3/uL (ref 0.0–0.7)
Eosinophils Relative: 2.1 % (ref 0.0–5.0)
HCT: 37.9 % (ref 36.0–46.0)
Hemoglobin: 12.8 g/dL (ref 12.0–15.0)
Lymphocytes Relative: 17.8 % (ref 12.0–46.0)
Lymphs Abs: 1.8 10*3/uL (ref 0.7–4.0)
MCHC: 33.9 g/dL (ref 30.0–36.0)
MCV: 88.1 fl (ref 78.0–100.0)
Monocytes Absolute: 0.6 10*3/uL (ref 0.1–1.0)
Monocytes Relative: 6 % (ref 3.0–12.0)
Neutro Abs: 7.6 10*3/uL (ref 1.4–7.7)
Neutrophils Relative %: 73.2 % (ref 43.0–77.0)
Platelets: 267 10*3/uL (ref 150.0–400.0)
RBC: 4.3 Mil/uL (ref 3.87–5.11)
RDW: 13.4 % (ref 11.5–15.5)
WBC: 10.4 10*3/uL (ref 4.0–10.5)

## 2021-06-05 LAB — LIPID PANEL
Cholesterol: 140 mg/dL (ref 0–200)
HDL: 47.2 mg/dL (ref >39.00)
LDL Cholesterol: 77 mg/dL (ref 0–99)
NonHDL: 92.57
Total CHOL/HDL Ratio: 3
Triglycerides: 79 mg/dL (ref 0.0–149.0)
VLDL: 15.8 mg/dL (ref 0.0–40.0)

## 2021-06-05 LAB — COMPREHENSIVE METABOLIC PANEL
ALT: 52 U/L — ABNORMAL HIGH (ref 0–35)
AST: 37 U/L (ref 0–37)
Albumin: 4.4 g/dL (ref 3.5–5.2)
Alkaline Phosphatase: 66 U/L (ref 39–117)
BUN: 10 mg/dL (ref 6–23)
CO2: 23 mEq/L (ref 19–32)
Calcium: 9.4 mg/dL (ref 8.4–10.5)
Chloride: 104 mEq/L (ref 96–112)
Creatinine, Ser: 0.6 mg/dL (ref 0.40–1.20)
GFR: 108.69 mL/min (ref >60.00)
Glucose, Bld: 93 mg/dL (ref 70–99)
Potassium: 4.1 mEq/L (ref 3.5–5.1)
Sodium: 137 mEq/L (ref 135–145)
Total Bilirubin: 1.6 mg/dL — ABNORMAL HIGH (ref 0.2–1.2)
Total Protein: 7.5 g/dL (ref 6.0–8.3)

## 2021-06-05 LAB — HEMOGLOBIN A1C: Hgb A1c MFr Bld: 5.9 % (ref 4.6–6.5)

## 2021-06-05 LAB — TSH: TSH: 2.22 u[IU]/mL (ref 0.35–5.50)

## 2021-06-05 MED ORDER — AMLODIPINE BESYLATE 5 MG PO TABS: 5.0000 mg | ORAL_TABLET | Freq: Every day | ORAL | 3 refills | Status: DC

## 2021-06-05 NOTE — Telephone Encounter (Signed)
Patient calling in  Patient had OV w/ provider today.. provider advised patient to continue taking  labetalol (NORMODYNE) 100 MG tablet but did not send in new rx   Please send new rx to [facility], [place].  Phone:  [phone] Fax:  [phone]

## 2021-06-05 NOTE — Assessment & Plan Note (Signed)
Diet and nutrition discussed. Advised to decrease amount of daily carbohydrate intake. The 10-year ASCVD risk score (Arnett DK, et al., 2019) is: 1.1%   Values used to calculate the score:     Age: 45 years     Sex: Female     Is Non-Hispanic African American: No     Diabetic: No     Tobacco smoker: No     Systolic Blood Pressure: 094 mmHg     Is BP treated: Yes     HDL Cholesterol: 50 mg/dL     Total Cholesterol: 153 mg/dL

## 2021-06-05 NOTE — Assessment & Plan Note (Signed)
Uncontrolled hypertension.  Continue labetalol 100 mg twice a day which she has been taking for about 7 years.  Start amlodipine 5 mg daily. Dietary approaches to stop hypertension discussed. Advised to monitor blood pressure readings at home daily for the next several weeks and keep a log. Follow-up in 3 months.

## 2021-06-05 NOTE — Patient Instructions (Signed)
Mantenimiento de Technical sales engineer en [place] Maintenance, Female Adoptar un estilo de vida saludable y recibir atencin preventiva son importantes para promover la salud y Musician. Consulte al mdico sobre: El esquema adecuado para hacerse pruebas y exmenes peridicos. Cosas que puede hacer por su cuenta para prevenir enfermedades y [place] sano. Qu debo saber sobre la dieta, el peso y el ejercicio? Consuma una dieta saludable  Consuma una dieta que incluya muchas verduras, frutas, productos lcteos con bajo contenido de [place] y Advertising account planner. No consuma muchos alimentos ricos en grasas slidas, azcares agregados o sodio. Mantenga un peso saludable El ndice de masa muscular [facility]) se [place] the [place] para identificar problemas de [place]. Proporciona una estimacin de la grasa corporal basndose en el peso y la altura. Su mdico puede ayudarle a Radiation protection practitioner [place] y a Scientist, forensic o Theatre manager un peso saludable. Haga ejercicio con regularidad Haga ejercicio con regularidad. Esta es una de las prcticas ms importantes que puede hacer por su salud. La [employer] de los adultos deben seguir estas pautas: Optometrist, al menos, 150 minutos de actividad fsica por semana. El ejercicio debe aumentar la frecuencia cardaca y Nature conservation officer transpirar (ejercicio de intensidad moderada). Hacer ejercicios de fortalecimiento por lo [employer] por semana. Agregue esto a su plan de ejercicio de intensidad moderada. Pase menos tiempo sentada. Incluso la actividad fsica ligera puede ser beneficiosa. Controle sus niveles de colesterol y lpidos en la sangre Comience a realizarse anlisis de lpidos y Research officer, trade union en la sangre a los 15 aos y luego reptalos cada 5 aos. Hgase controlar los niveles de colesterol con mayor frecuencia si: Sus niveles de lpidos y colesterol son altos. Es mayor de 67 aos. Presenta un alto riesgo de padecer enfermedades cardacas. Qu debo saber sobre las pruebas de deteccin del  cncer? Segn su historia clnica y sus antecedentes familiares, es posible que deba realizarse pruebas de deteccin del cncer en diferentes edades. Esto puede incluir pruebas de deteccin de lo siguiente: Cncer de mama. Cncer de cuello uterino. Cncer colorrectal. Cncer de piel. Cncer de pulmn. Qu debo saber sobre la enfermedad cardaca, la diabetes y la hipertensin arterial? Presin arterial y enfermedad cardaca La hipertensin arterial causa enfermedades cardacas y [place] el riesgo de accidente cerebrovascular. Es ms probable que esto se manifieste en las personas que tienen lecturas de presin arterial alta o tienen sobrepeso. Hgase controlar la presin arterial: Cada 3 a 5 aos si tiene entre 18 y 62 aos. Todos los aos si es mayor de 40 aos. Diabetes Realcese exmenes de deteccin de la diabetes con regularidad. Este anlisis revisa el nivel de azcar en la sangre en [place]. Hgase las pruebas de deteccin: Cada tres aos despus de los 14 aos de edad si tiene un peso normal y un bajo riesgo de padecer diabetes. Con ms frecuencia y a partir de [place] edad inferior si tiene sobrepeso o un alto riesgo de padecer diabetes. Qu debo saber sobre la prevencin de infecciones? Hepatitis B Si tiene un riesgo ms alto de contraer hepatitis B, debe someterse a un examen de deteccin de este virus. Hable con el mdico para averiguar si tiene riesgo de contraer la infeccin por hepatitis B. Hepatitis C Se recomienda el anlisis a: [employer] 1945 y 1965. Todas las personas que tengan un riesgo de haber contrado hepatitis C. Enfermedades de transmisin sexual (ETS) Hgase las pruebas de Programme researcher, broadcasting/film/video de ITS, incluidas la gonorrea y la clamidia, si: Es sexualmente activa y es Garment/textile technologist de 24  aos. Es mayor de 21 aos, y el mdico le informa que corre riesgo de tener este tipo de infecciones. La actividad sexual ha cambiado desde que le hicieron la ltima prueba de  deteccin y tiene un riesgo mayor de Best boy clamidia o Radio broadcast assistant. Pregntele al mdico si usted tiene riesgo. Pregntele al mdico si usted tiene un alto riesgo de Museum/gallery curator VIH. El mdico tambin puede recomendarle un medicamento recetado para ayudar a evitar la infeccin por el VIH. Si elige tomar medicamentos para prevenir el VIH, primero debe [employer] de deteccin del VIH. Luego debe hacerse anlisis cada 3 meses mientras est tomando los medicamentos. Embarazo Si est por dejar de Librarian, academic (fase premenopusica) y usted puede quedar [place], busque asesoramiento antes de [place]. Tome de 400 a 800 microgramos (mcg) de cido [employer] si [place]. Pida mtodos de control de la natalidad (anticonceptivos) si desea evitar un embarazo no deseado. Osteoporosis y [place] La osteoporosis es una enfermedad en la que los huesos pierden los minerales y la fuerza por el avance de la edad. El resultado pueden ser fracturas en los [place]. Si tiene 29 aos o ms, o si est en riesgo de sufrir osteoporosis y fracturas, pregunte a su mdico si debe: Hacerse pruebas de deteccin de prdida sea. Tomar un suplemento de calcio o de vitamina D para reducir el riesgo de fracturas. Recibir terapia de reemplazo hormonal (TRH) para tratar los sntomas de la menopausia. Siga estas indicaciones en su casa: Consumo de alcohol No beba alcohol si: Su mdico le indica no hacerlo. Est embarazada, puede estar embarazada o est tratando de [place]. Si bebe alcohol: Limite la cantidad que bebe a lo siguiente: De 0 a 1 bebida por da. Sepa cunta cantidad de alcohol hay en las bebidas que toma. En los [place], una medida equivale a una botella de cerveza de 12 oz (355 ml), un vaso de vino de 5 oz (148 ml) o un vaso de una bebida alcohlica de alta graduacin de 1 oz (44 ml). Estilo de vida No consuma ningn producto que contenga nicotina o tabaco. Estos  productos incluyen cigarrillos, tabaco para Higher education careers adviser y aparatos de vapeo, como los Psychologist, sport and exercise. Si necesita ayuda para dejar de consumir estos productos, consulte al mdico. No consuma drogas. No comparta agujas. Solicite ayuda a su mdico si necesita apoyo o informacin para abandonar las drogas. Indicaciones generales Realcese los estudios de rutina de la salud, dentales y de Public librarian. [place]. Infrmele a su mdico si: Se siente deprimida con frecuencia. Alguna vez ha sido vctima de [place] o no se siente seguro en su casa. Resumen Adoptar un estilo de vida saludable y recibir atencin preventiva son importantes para promover la salud y Musician. Siga las instrucciones del mdico acerca de una dieta saludable, el ejercicio y la realizacin de pruebas o exmenes para Engineer, building services. Siga las instrucciones del mdico con respecto al control del colesterol y la presin arterial. Esta informacin no tiene Marine scientist el consejo del mdico. Asegrese de hacerle al mdico cualquier pregunta que tenga. Document Revised: 11/29/2020 Document Reviewed: 11/29/2020 Elsevier Patient Education  [place].

## 2021-06-05 NOTE — Progress Notes (Signed)
Kristen Madden 45 y.o.   Chief Complaint  Patient presents with   Hypertension    F/U, discuss colonoscopy    HISTORY OF PRESENT ILLNESS: This is a 45 y.o. female with history of hypertension on labetalol 100 mg twice a day here for follow-up. Also interested in discussing possible colonoscopy. No other complaints or medical concerns today. BP Readings from Last 3 Encounters:  06/05/21 (!) 148/82  06/14/20 130/80  06/16/19 (!) 148/68     Hypertension Pertinent negatives include no chest pain, headaches, palpitations or shortness of breath.    Prior to Admission medications   Medication Sig Start Date End Date Taking? Authorizing Provider  labetalol (NORMODYNE) 100 MG tablet Take 1 tablet (100 mg total) by mouth 2 (two) times daily. 06/14/20 06/05/21 Yes SagardiaInes Bloomer, MD    No Known Allergies  Patient Active Problem List   Diagnosis Date Noted   Obesity 07/21/2018   Adenomyosis 07/21/2018   HTN (hypertension) 06/28/2013    Past Medical History:  Diagnosis Date   Hypertension    Ovarian cyst     Past Surgical History:  Procedure Laterality Date   CHOLECYSTECTOMY N/A 05/16/2014   Procedure: LAPAROSCOPIC CHOLECYSTECTOMY WITH INTRAOPERATIVE CHOLANGIOGRAM;  Surgeon: [name], MD;  Location: [place];  Service: General;  Laterality: N/A;   NO PAST SURGERIES      Social History   Socioeconomic History   Marital status: Married    Spouse name: Not on file   Number of children: Not on file   Years of education: Not on file   Highest education level: Not on file  Occupational History   Not on file  Tobacco Use   Smoking status: Never   Smokeless tobacco: Never  Vaping Use   Vaping Use: Never used  Substance and Sexual Activity   Alcohol use: No   Drug use: No   Sexual activity: Yes    Birth control/protection: Condom  Other Topics Concern   Not on file  Social History Narrative   Not on file   Social Determinants of Health    Financial Resource Strain: Not on file  Food Insecurity: Not on file  Transportation Needs: Not on file  Physical Activity: Not on file  Stress: Not on file  Social Connections: Not on file  Intimate Partner Violence: Not on file    Family History  Problem Relation Age of Onset   Hypertension Mother      Review of Systems  Constitutional: Negative.  Negative for chills and fever.  HENT: Negative.  Negative for congestion and sore throat.   Respiratory: Negative.  Negative for cough and shortness of breath.   Cardiovascular: Negative.  Negative for chest pain and palpitations.  Gastrointestinal: Negative.  Negative for abdominal pain, blood in stool, constipation, diarrhea, melena, nausea and vomiting.  Genitourinary: Negative.  Negative for dysuria and hematuria.  Musculoskeletal: Negative.   Skin: Negative.  Negative for rash.  Neurological: Negative.  Negative for dizziness and headaches.  All other systems reviewed and are negative.  Today's Vitals   06/05/21 1020  BP: (!) 148/82  Pulse: 79  SpO2: 99%  Weight: 247 lb (112 kg)  Height: 5' 3"  (1.6 m)   Body mass index is 43.75 kg/m. Wt Readings from Last 3 Encounters:  06/05/21 247 lb (112 kg)  06/14/20 242 lb (109.8 kg)  06/16/19 238 lb 12.8 oz (108.3 kg)    Physical Exam Vitals reviewed.  Constitutional:      Appearance:  Normal appearance. She is obese.  HENT:     Head: Normocephalic.     Mouth/Throat:     Mouth: Mucous membranes are moist.     Pharynx: Oropharynx is clear.  Eyes:     Extraocular Movements: Extraocular movements intact.     Conjunctiva/sclera: Conjunctivae normal.     Pupils: Pupils are equal, round, and reactive to light.  Cardiovascular:     Rate and Rhythm: Normal rate and regular rhythm.     Pulses: Normal pulses.     Heart sounds: Normal heart sounds.  Pulmonary:     Effort: Pulmonary effort is normal.     Breath sounds: Normal breath sounds.  Musculoskeletal:         General: Normal range of motion.     Cervical back: Normal range of motion and neck supple.  Skin:    General: Skin is warm and dry.     Capillary Refill: Capillary refill takes less than 2 seconds.  Neurological:     General: No focal deficit present.     Mental Status: She is alert and oriented to person, place, and time.  Psychiatric:        Mood and Affect: Mood normal.        Behavior: Behavior normal.     ASSESSMENT & PLAN: Problem List Items Addressed This Visit       Cardiovascular and Mediastinum   HTN (hypertension) - Primary    Uncontrolled hypertension.  Continue labetalol 100 mg twice a day which she has been taking for about 7 years.  Start amlodipine 5 mg daily. Dietary approaches to stop hypertension discussed. Advised to monitor blood pressure readings at home daily for the next several weeks and keep a log. Follow-up in 3 months.      Relevant Medications   amLODipine (NORVASC) 5 MG tablet   Other Relevant Orders   CBC with Differential/Platelet   Comprehensive metabolic panel     Other   Body mass index (BMI) of 40.1-44.9 in adult [facility])    Diet and nutrition discussed. Advised to decrease amount of daily carbohydrate intake. The 10-year ASCVD risk score (Arnett DK, et al., 2019) is: 1.1%   Values used to calculate the score:     Age: 35 years     Sex: Female     Is Non-Hispanic African American: No     Diabetic: No     Tobacco smoker: No     Systolic Blood Pressure: 101 mmHg     Is BP treated: Yes     HDL Cholesterol: 50 mg/dL     Total Cholesterol: 153 mg/dL       Relevant Orders   CBC with Differential/Platelet   Hemoglobin A1c   Lipid panel   TSH   Other Visit Diagnoses     Colon cancer screening       Relevant Orders   Cologuard      Patient Instructions  Mantenimiento de la salud en [place] Maintenance, Female Adoptar un estilo de vida saludable y recibir atencin preventiva son importantes para promover la salud y  Musician. Consulte al mdico sobre: El esquema adecuado para hacerse pruebas y exmenes peridicos. Cosas que puede hacer por su cuenta para prevenir enfermedades y [place] sano. Qu debo saber sobre la dieta, el peso y el ejercicio? Consuma una dieta saludable  Consuma una dieta que incluya muchas verduras, frutas, productos lcteos con bajo contenido de [place] y Advertising account planner. No consuma muchos alimentos  ricos en grasas slidas, azcares agregados o sodio. Mantenga un peso saludable El ndice de masa muscular [facility]) se [place] para identificar problemas de [place]. Proporciona una estimacin de la grasa corporal basndose en el peso y la altura. Su mdico puede ayudarle a Radiation protection practitioner [place] y a Scientist, forensic o Theatre manager un peso saludable. Haga ejercicio con regularidad Haga ejercicio con regularidad. Esta es una de las prcticas ms importantes que puede hacer por su salud. La [employer] de los adultos deben seguir estas pautas: Optometrist, al menos, 150 minutos de actividad fsica por semana. El ejercicio debe aumentar la frecuencia cardaca y Nature conservation officer transpirar (ejercicio de intensidad moderada). Hacer ejercicios de fortalecimiento por lo [employer] por semana. Agregue esto a su plan de ejercicio de intensidad moderada. Pase menos tiempo sentada. Incluso la actividad fsica ligera puede ser beneficiosa. Controle sus niveles de colesterol y lpidos en la sangre Comience a realizarse anlisis de lpidos y Research officer, trade union en la sangre a los 37 aos y luego reptalos cada 5 aos. Hgase controlar los niveles de colesterol con mayor frecuencia si: Sus niveles de lpidos y colesterol son altos. Es mayor de 43 aos. Presenta un alto riesgo de padecer enfermedades cardacas. Qu debo saber sobre las pruebas de deteccin del cncer? Segn su historia clnica y sus antecedentes familiares, es posible que deba realizarse pruebas de deteccin del cncer en diferentes edades. Esto puede incluir pruebas de  deteccin de lo siguiente: Cncer de mama. Cncer de cuello uterino. Cncer colorrectal. Cncer de piel. Cncer de pulmn. Qu debo saber sobre la enfermedad cardaca, la diabetes y la hipertensin arterial? Presin arterial y enfermedad cardaca La hipertensin arterial causa enfermedades cardacas y [place] el riesgo de accidente cerebrovascular. Es ms probable que esto se manifieste en las personas que tienen lecturas de presin arterial alta o tienen sobrepeso. Hgase controlar la presin arterial: Cada 3 a 5 aos si tiene entre 18 y 27 aos. Todos los aos si es mayor de 40 aos. Diabetes Realcese exmenes de deteccin de la diabetes con regularidad. Este anlisis revisa el nivel de azcar en la sangre en [place]. Hgase las pruebas de deteccin: Cada tres aos despus de los 33 aos de edad si tiene un peso normal y un bajo riesgo de padecer diabetes. Con ms frecuencia y a partir de [place] edad inferior si tiene sobrepeso o un alto riesgo de padecer diabetes. Qu debo saber sobre la prevencin de infecciones? Hepatitis B Si tiene un riesgo ms alto de contraer hepatitis B, debe someterse a un examen de deteccin de este virus. Hable con el mdico para averiguar si tiene riesgo de contraer la infeccin por hepatitis B. Hepatitis C Se recomienda el anlisis a: [employer] 1945 y 1965. Todas las personas que tengan un riesgo de haber contrado hepatitis C. Enfermedades de transmisin sexual (ETS) Hgase las pruebas de Programme researcher, broadcasting/film/video de ITS, incluidas la gonorrea y la clamidia, si: Es sexualmente activa y es menor de 97 aos. Es mayor de 52 aos, y Investment banker, operational informa que corre riesgo de tener este tipo de infecciones. La actividad sexual ha cambiado desde que le hicieron la ltima prueba de deteccin y tiene un riesgo mayor de Best boy clamidia o Radio broadcast assistant. Pregntele al mdico si usted tiene riesgo. Pregntele al mdico si usted tiene un alto riesgo de Museum/gallery curator VIH. El  mdico tambin puede recomendarle un medicamento recetado para ayudar a evitar la infeccin por el VIH. Si elige tomar medicamentos para prevenir el VIH, primero debe [employer]  anlisis de deteccin del VIH. Luego debe hacerse anlisis cada 3 meses mientras est tomando los medicamentos. Embarazo Si est por dejar de Librarian, academic (fase premenopusica) y usted puede quedar [place], busque asesoramiento antes de [place]. Tome de 400 a 800 microgramos (mcg) de cido [employer] si [place]. Pida mtodos de control de la natalidad (anticonceptivos) si desea evitar un embarazo no deseado. Osteoporosis y [place] La osteoporosis es una enfermedad en la que los huesos pierden los minerales y la fuerza por el avance de la edad. El resultado pueden ser fracturas en los [place]. Si tiene 54 aos o ms, o si est en riesgo de sufrir osteoporosis y fracturas, pregunte a su mdico si debe: Hacerse pruebas de deteccin de prdida sea. Tomar un suplemento de calcio o de vitamina D para reducir el riesgo de fracturas. Recibir terapia de reemplazo hormonal (TRH) para tratar los sntomas de la menopausia. Siga estas indicaciones en su casa: Consumo de alcohol No beba alcohol si: Su mdico le indica no hacerlo. Est embarazada, puede estar embarazada o est tratando de [place]. Si bebe alcohol: Limite la cantidad que bebe a lo siguiente: De 0 a 1 bebida por da. Sepa cunta cantidad de alcohol hay en las bebidas que toma. En los [place], una medida equivale a una botella de cerveza de 12 oz (355 ml), un vaso de vino de 5 oz (148 ml) o un vaso de una bebida alcohlica de alta graduacin de 1 oz (44 ml). Estilo de vida No consuma ningn producto que contenga nicotina o tabaco. Estos productos incluyen cigarrillos, tabaco para Higher education careers adviser y aparatos de vapeo, como los Psychologist, sport and exercise. Si necesita ayuda para dejar de consumir estos productos, consulte al mdico. No  consuma drogas. No comparta agujas. Solicite ayuda a su mdico si necesita apoyo o informacin para abandonar las drogas. Indicaciones generales Realcese los estudios de rutina de la salud, dentales y de Public librarian. [place]. Infrmele a su mdico si: Se siente deprimida con frecuencia. Alguna vez ha sido vctima de [place] o no se siente seguro en su casa. Resumen Adoptar un estilo de vida saludable y recibir atencin preventiva son importantes para promover la salud y Musician. Siga las instrucciones del mdico acerca de una dieta saludable, el ejercicio y la realizacin de pruebas o exmenes para Engineer, building services. Siga las instrucciones del mdico con respecto al control del colesterol y la presin arterial. Esta informacin no tiene Marine scientist el consejo del mdico. Asegrese de hacerle al mdico cualquier pregunta que tenga. Document Revised: 11/29/2020 Document Reviewed: 11/29/2020 Elsevier Patient Education  2022 Will, MD [facility]

## 2021-06-06 MED ORDER — LABETALOL HCL 100 MG PO TABS: 100.0000 mg | ORAL_TABLET | Freq: Two times a day (BID) | ORAL | 3 refills | Status: DC

## 2021-06-06 NOTE — Telephone Encounter (Signed)
Refilled medication

## 2021-07-09 ENCOUNTER — Telehealth: Payer: Self-pay | Admitting: Emergency Medicine

## 2021-07-09 NOTE — Telephone Encounter (Signed)
Patient called in stating she received a stool kit, read the directions and called to asked if it would be okay if she took the test still if she had bleeding hemorrhoids. The nurse said it would be fine and advised patient to still take test.

## 2021-07-17 LAB — COLOGUARD: COLOGUARD: NEGATIVE

## 2021-09-04 ENCOUNTER — Encounter: Payer: Self-pay | Admitting: Emergency Medicine

## 2021-09-04 ENCOUNTER — Other Ambulatory Visit: Payer: Self-pay

## 2021-09-04 ENCOUNTER — Ambulatory Visit (INDEPENDENT_AMBULATORY_CARE_PROVIDER_SITE_OTHER): Payer: Self-pay | Admitting: Emergency Medicine

## 2021-09-04 VITALS — BP 152/82 | HR 66 | Temp 98.2°F | Ht 63.0 in | Wt 255.0 lb

## 2021-09-04 DIAGNOSIS — I10 Essential (primary) hypertension: Secondary | ICD-10-CM

## 2021-09-04 NOTE — Patient Instructions (Signed)
Hipertensi?n en los adultos ?Hypertension, Adult ?El t?rmino hipertensi?n es otra forma de denominar a la presi?n arterial elevada. La presi?n arterial elevada fuerza al coraz?n a trabajar m?s para bombear la sangre. Esto puede causar problemas con el paso del [place]. ?Una lectura de presi?n arterial est? compuesta por 2 n?meros. Hay un n?mero superior (sist?lico) sobre un n?mero inferior (diast?lico). Lo ideal es tener la presi?n arterial por debajo de 120/80. Las elecciones saludables pueden ayudar a Engineer, materials presi?n arterial, o tal vez necesite medicamentos para bajarla. ??Cu?les son las causas? ?Se desconoce la causa de esta afecci?n. Algunas afecciones pueden estar relacionadas con la presi?n arterial alta. ??Qu? incrementa el riesgo? ?Fumar. ?Tener diabetes mellitus tipo 2, colesterol alto, o ambos. ?No hacer la cantidad suficiente de actividad f?sica o ejercicio. ?Tener sobrepeso. ?Consumir mucha grasa, az?car, calor?as o sal (sodio) en su dieta. ?Beber alcohol en exceso. ?Tener una enfermedad renal a largo plazo (cr?nica). ?Tener antecedentes familiares de presi?n arterial alta. ?Edad. Los riesgos aumentan con la edad. ?[place]. El riesgo es mayor para las Retail banker. ?Sexo. Antes de los 45 a?os, los hombres corren m?s riesgo que las mujeres. Despu?s de los 65 a?os, las mujeres corren m?s riesgo que los hombres. ?Tener apnea obstructiva del sue?o. ?Estr?s. ??Cu?les son los signos o los s?ntomas? ?Es posible que la presi?n arterial alta puede no cause s?ntomas. La presi?n arterial muy alta (crisis hipertensiva) puede provocar: ?Dolor de [place]. ?Sensaciones de preocupaci?n o nerviosismo (ansiedad). ?Falta de aire. ?Hemorragia nasal. ?Sensaci?n de malestar en el est?mago (n?useas). ?V?mitos. ?Cambios en la forma de ver. ?Dolor Energy manager. ?Convulsiones. ??C?mo se trata? ?Esta afecci?n se trata haciendo cambios saludables en el estilo de vida, por ejemplo: ?Consumir alimentos  saludables. ?Hacer m?s ejercicio. ?Beber menos alcohol. ?El m?dico puede recetarle medicamentos si los cambios en el estilo de vida no son suficientes para Child psychotherapist la presi?n arterial y si: ?El n?mero de arriba est? por encima de 130. ?El n?mero de abajo est? por encima de 80. ?Su presi?n arterial personal ideal puede variar. ?Siga estas instrucciones en su casa: ?Comida y bebida ? ?Si se lo dicen, siga el plan de alimentaci?n de DASH (Dietary Approaches to Stop Hypertension, Maneras de alimentarse para detener la hipertensi?n). Para seguir este plan: ?Llene la mitad del plato de cada comida con frutas y verduras. ?Llene un cuarto del plato de cada comida con cereales integrales. Los cereales integrales incluyen pasta integral, arroz integral y pan integral. ?Coma y beba productos l?cteos con bajo contenido de grasa, como leche descremada o yogur bajo en grasas. ?Llene un cuarto del plato de cada comida con prote?nas bajas en grasa (magras). Las prote?nas bajas en grasa incluyen pescado, pollo sin piel, huevos, frijoles y tofu. ?Evite consumir carne grasa, carne curada y procesada, o pollo con piel. ?Evite consumir alimentos prehechos o procesados. ?Consuma menos de 1500 mg de sal por d?a. ?No beba alcohol si: ?El m?dico le indica que no lo haga. ?Est? embarazada, puede estar embarazada o est? tratando de quedar embarazada. ?Si bebe alcohol: ?Limite la cantidad que bebe a lo siguiente: ?De 0 a 1 medida por d?a para las mujeres. ?De 0 a 2 medidas por d?a para los hombres. ?Est? atento a la cantidad de alcohol que hay en las bebidas que toma. En los [place], una medida equivale a una botella de cerveza de 12 oz (355 ml), un vaso de vino de 5 oz (148 ml) o un vaso de una bebida alcoh?lica de  alta graduaci?n de 1? oz (44 ml). ?[place] ? ?Trabaje con su m?dico para mantenerse en un peso saludable o para perder peso. Preg?ntele a su m?dico cu?l es el peso recomendable para usted. ?Haga al menos 30  minutos de ejercicio la mayor?a de los d?as de la semana. Estos pueden incluir caminar, nadar o andar en bicicleta. ?Realice al menos 30 minutos de ejercicio que fortalezca sus m?sculos (ejercicios de resistencia) al menos 3 d?as a la semana. Estos pueden incluir levantar pesas o hacer [place]. ?No consuma ning?n producto que contenga nicotina o tabaco, como cigarrillos, cigarrillos electr?nicos y tabaco de Higher education careers adviser. Si necesita ayuda para dejar de fumar, consulte al m?dico. ?Controle su presi?n arterial en su casa tal como le indic? el m?dico. ?Concurra a todas las visitas de seguimiento como se lo haya indicado el m?dico. Esto es importante. ?Medicamentos ?Tome los medicamentos de venta libre y los recetados solamente como se lo haya indicado el m?dico. Siga cuidadosamente las indicaciones. ?No omita las dosis de medicamentos para la presi?n arterial. Los medicamentos pierden eficacia si omite dosis. El hecho de omitir las dosis tambi?n [place] el riesgo de otros problemas. ?Preg?ntele a su m?dico a qu? efectos secundarios o reacciones a los Holiday representative atenci?n. ?Comun?quese con un m?dico si: ?Piensa que tiene una reacci?n a los medicamentos que est? tomando. ?Tiene dolores de cabeza frecuentes (recurrentes). ?Se siente mareado. ?Tiene hinchaz?n en los tobillos. ?Tiene problemas de visi?n. ?Solicite ayuda inmediatamente si: ?Siente un dolor de cabeza muy intenso. ?Empieza a sentirse desorientado (confundido). ?Se siente d?bil o adormecido. ?Siente que va a desmayarse. ?Tiene un dolor muy intenso en las siguientes zonas: ?[place]. ?Vientre (abdomen). ?Vomita m?s de [employer]. ?Tiene dificultad para respirar. ?Resumen ?El t?rmino hipertensi?n es otra forma de denominar a la presi?n arterial elevada. ?La presi?n arterial elevada fuerza al coraz?n a trabajar m?s para bombear la sangre. ?[place], una presi?n arterial normal es menor que 120/80. ?Las decisiones saludables pueden ayudarle  a disminuir su presi?n arterial. Si no puede bajar su presi?n arterial mediante decisiones saludables, es posible que deba tomar medicamentos. ?Esta informaci?n no tiene Marine scientist el consejo del m?dico. Aseg?rese de hacerle al m?dico cualquier pregunta que tenga. ?Document Revised: 04/08/2018 Document Reviewed: 04/08/2018 ?Elsevier Patient Education ? [place]. ? ?

## 2021-09-04 NOTE — Progress Notes (Signed)
Kristen Madden 46 y.o.   Chief Complaint  Patient presents with   Hypertension    F/u    HISTORY OF PRESENT ILLNESS: This is a 46 y.o. female with history of hypertension here for follow-up. Presently taking amlodipine 5 mg daily and labetalol 100 mg twice a day. Compliant with medication.  However does not check blood pressure readings at home. States her blood pressure is always high when she comes to the doctor's office. Asymptomatic. No complaints or medical concerns today. BP Readings from Last 3 Encounters:  09/04/21 (!) 152/82  06/05/21 (!) 148/82  06/14/20 130/80     Hypertension Pertinent negatives include no headaches or shortness of breath.    Prior to Admission medications   Medication Sig Start Date End Date Taking? Authorizing Provider  amLODipine (NORVASC) 5 MG tablet Take 1 tablet (5 mg total) by mouth daily. 06/05/21   Horald Pollen, MD  labetalol (NORMODYNE) 100 MG tablet Take 1 tablet (100 mg total) by mouth 2 (two) times daily. 06/06/21 09/04/21  Horald Pollen, MD    No Known Allergies  Patient Active Problem List   Diagnosis Date Noted   Body mass index (BMI) of 40.1-44.9 in adult [facility]) 06/05/2021   Obesity 07/21/2018   Adenomyosis 07/21/2018   HTN (hypertension) 06/28/2013    Past Medical History:  Diagnosis Date   Hypertension    Ovarian cyst     Past Surgical History:  Procedure Laterality Date   CHOLECYSTECTOMY N/A 05/16/2014   Procedure: LAPAROSCOPIC CHOLECYSTECTOMY WITH INTRAOPERATIVE CHOLANGIOGRAM;  Surgeon: [name], MD;  Location: [facility] OR;  Service: General;  Laterality: N/A;   NO PAST SURGERIES      Social History   Socioeconomic History   Marital status: Married    Spouse name: Not on file   Number of children: Not on file   Years of education: Not on file   Highest education level: Not on file  Occupational History   Not on file  Tobacco Use   Smoking status: Never   Smokeless tobacco: Never   Vaping Use   Vaping Use: Never used  Substance and Sexual Activity   Alcohol use: No   Drug use: No   Sexual activity: Yes    Birth control/protection: Condom  Other Topics Concern   Not on file  Social History Narrative   Not on file   Social Determinants of Health   Financial Resource Strain: Not on file  Food Insecurity: Not on file  Transportation Needs: Not on file  Physical Activity: Not on file  Stress: Not on file  Social Connections: Not on file  Intimate Partner Violence: Not on file    Family History  Problem Relation Age of Onset   Hypertension Mother      Review of Systems  Constitutional: Negative.  Negative for chills and fever.  HENT: Negative.  Negative for congestion and sore throat.   Respiratory: Negative.  Negative for cough and shortness of breath.   Gastrointestinal:  Negative for abdominal pain, diarrhea, nausea and vomiting.  Genitourinary: Negative.  Negative for dysuria and flank pain.  Skin: Negative.  Negative for rash.  Neurological: Negative.  Negative for dizziness and headaches.  All other systems reviewed and are negative.  Today's Vitals   09/04/21 0817  BP: (!) 152/82  Pulse: 66  Temp: 98.2 F (36.8 C)  TempSrc: Oral  SpO2: 99%  Weight: 255 lb (115.7 kg)  Height: 5' 3"  (1.6 m)   Body  mass index is 45.17 kg/m.  Physical Exam Vitals reviewed.  Constitutional:      Appearance: Normal appearance. She is obese.  HENT:     Head: Normocephalic.  Eyes:     Extraocular Movements: Extraocular movements intact.     Pupils: Pupils are equal, round, and reactive to light.  Cardiovascular:     Rate and Rhythm: Normal rate and regular rhythm.     Pulses: Normal pulses.     Heart sounds: Normal heart sounds.  Pulmonary:     Effort: Pulmonary effort is normal.     Breath sounds: Normal breath sounds.  Musculoskeletal:     Cervical back: No tenderness.     Right lower leg: No edema.     Left lower leg: No edema.   Lymphadenopathy:     Cervical: No cervical adenopathy.  Skin:    General: Skin is warm and dry.  Neurological:     General: No focal deficit present.     Mental Status: She is alert and oriented to person, place, and time.  Psychiatric:        Mood and Affect: Mood normal.        Behavior: Behavior normal.     ASSESSMENT & PLAN: Problem List Items Addressed This Visit       Cardiovascular and Mediastinum   HTN (hypertension)    Blood pressure not at goal yet.  Some degree of white coat syndrome. Continue labetalol 100 mg twice a day. Advised to monitor blood pressure readings at home daily for the next several weeks and keep a log.  Increase amlodipine to 10 mg daily as needed. Dietary approaches to stop hypertension discussed. Follow-up in 4 weeks.      Other Visit Diagnoses     Essential hypertension    -  Primary      Patient Instructions  Hipertensin en los adultos Hypertension, Adult El trmino hipertensin es otra forma de denominar a la presin arterial elevada. La presin arterial elevada fuerza al corazn a trabajar ms para bombear la sangre. Esto puede causar problemas con el paso del [place]. Una lectura de presin arterial est compuesta por 2 nmeros. Hay un nmero superior (sistlico) sobre un nmero inferior (diastlico). Lo ideal es tener la presin arterial por debajo de 120/80. Las elecciones saludables pueden ayudar a Engineer, materials presin arterial, o tal vez necesite medicamentos para bajarla. Cules son las causas? Se desconoce la causa de esta afeccin. Algunas afecciones pueden estar relacionadas con la presin arterial alta. Qu incrementa el riesgo? Fumar. Tener diabetes mellitus tipo 2, colesterol alto, o ambos. No hacer la cantidad suficiente de actividad fsica o ejercicio. Tener sobrepeso. Consumir mucha grasa, azcar, caloras o sal (sodio) en su dieta. Beber alcohol en exceso. Tener una enfermedad renal a largo plazo (crnica). Tener  antecedentes familiares de presin arterial alta. Edad. Los riesgos aumentan con la edad. Raza. El riesgo es mayor para las Retail banker. Sexo. Antes de los 45 aos, los hombres corren ms [employer]. Despus de los 65 aos, las mujeres corren ms [employer]. Tener apnea obstructiva del sueo. Estrs. Cules son los signos o los sntomas? Es posible que la presin arterial alta puede no cause sntomas. La presin arterial muy alta (crisis hipertensiva) puede provocar: Dolor de cabeza. Sensaciones de preocupacin o nerviosismo (ansiedad). Falta de aire. Hemorragia nasal. Sensacin de malestar en el estmago (nuseas). Vmitos. Cambios en la forma de ver. Dolor muy intenso en el pecho. Convulsiones. Cmo  se trata? Esta afeccin se trata haciendo cambios saludables en el estilo de vida, por ejemplo: Consumir alimentos saludables. Hacer ms ejercicio. Beber menos alcohol. El mdico puede recetarle medicamentos si los cambios en el estilo de vida no son suficientes para Child psychotherapist la presin arterial y si: El nmero de arriba est por encima de 130. El nmero de abajo est por encima de 80. Su presin arterial personal ideal puede variar. Siga estas instrucciones en su casa: Comida y bebida  Si se lo dicen, siga el plan de alimentacin de DASH (Dietary Approaches to Stop Hypertension, Maneras de alimentarse para detener la hipertensin). Para seguir este plan: Llene la mitad del plato de cada comida con frutas y verduras. Llene un cuarto del plato de cada comida con cereales integrales. Los cereales integrales incluyen pasta integral, arroz integral y pan integral. Coma y beba productos lcteos con bajo contenido de grasa, como leche descremada o yogur bajo en grasas. Llene un cuarto del plato de cada comida con protenas bajas en grasa (magras). Las protenas bajas en grasa incluyen pescado, pollo sin piel, huevos, frijoles y tofu. Evite  consumir carne grasa, carne curada y procesada, o pollo con piel. Evite consumir alimentos prehechos o procesados. Consuma menos de 1500 mg de sal por da. No beba alcohol si: El mdico le indica que no lo haga. Est embarazada, puede estar embarazada o est tratando de [place]. Si bebe alcohol: Limite la cantidad que bebe a lo siguiente: De 0 a 1 medida por da para las mujeres. De 0 a 2 medidas por da para los hombres. Est atento a la cantidad de alcohol que hay en las bebidas que toma. En los [place], una medida equivale a una botella de cerveza de 12 oz (355 ml), un vaso de vino de 5 oz (148 ml) o un vaso de una bebida alcohlica de alta graduacin de 1 oz (44 ml). Estilo de vida  Trabaje con su mdico para mantenerse en un peso saludable o para perder peso. Pregntele a su mdico cul es el peso recomendable para usted. Haga al menos 30 minutos de ejercicio la [employer] de la [place]. Estos pueden incluir caminar, nadar o andar en bicicleta. Realice al menos 30 minutos de ejercicio que fortalezca sus msculos (ejercicios de resistencia) al menos 3 das a la [place]. Estos pueden incluir levantar pesas o hacer [place]. No consuma ningn producto que contenga nicotina o tabaco, como cigarrillos, cigarrillos electrnicos y tabaco de Higher education careers adviser. Si necesita ayuda para dejar de fumar, consulte al [employer]. Controle su presin arterial en su casa tal como le indic el mdico. Concurra a todas las visitas de seguimiento como se lo haya indicado el mdico. Esto es importante. Medicamentos [employer] de venta libre y los recetados solamente como se lo haya indicado el mdico. Siga cuidadosamente las indicaciones. No omita las dosis de medicamentos para la presin arterial. Los medicamentos pierden eficacia si omite dosis. El hecho de omitir las dosis tambin [place] el riesgo de otros problemas. Pregntele a su mdico a qu efectos secundarios o reacciones a los  Careers information officer. Comunquese con un mdico si: Piensa que tiene [place] reaccin a los medicamentos que est tomando. Tiene dolores de cabeza frecuentes (recurrentes). Se siente mareado. Tiene hinchazn en los tobillos. Tiene problemas de visin. Solicite ayuda inmediatamente si: Siente un dolor de cabeza muy intenso. Empieza a sentirse desorientado (confundido). Se siente dbil o adormecido. Siente que va a desmayarse. Tiene Location manager  intenso en las siguientes zonas: Pecho. Vientre (abdomen). Vomita ms de una vez. Tiene dificultad para respirar. Resumen El trmino hipertensin es otra forma de denominar a la presin arterial elevada. La presin arterial elevada fuerza al corazn a trabajar ms para bombear la sangre. Para la [employer], una presin arterial normal es menor que 120/80. Las decisiones saludables pueden ayudarle a disminuir su presin arterial. Si no puede bajar su presin arterial mediante decisiones saludables, es posible que deba tomar medicamentos. Esta informacin no tiene Marine scientist el consejo del mdico. Asegrese de hacerle al mdico cualquier pregunta que tenga. Document Revised: 04/08/2018 Document Reviewed: 04/08/2018 Elsevier Patient Education  2022 [place], MD [facility]

## 2021-09-04 NOTE — Assessment & Plan Note (Addendum)
Blood pressure not at goal yet.  Some degree of white coat syndrome. ?Continue labetalol 100 mg twice a day. ?Advised to monitor blood pressure readings at home daily for the next several weeks and keep a log.  Increase amlodipine to 10 mg daily as needed. ?Dietary approaches to stop hypertension discussed. ?Follow-up in 4 weeks. ?

## 2021-10-03 ENCOUNTER — Encounter: Payer: Self-pay | Admitting: Emergency Medicine

## 2021-10-03 ENCOUNTER — Ambulatory Visit (INDEPENDENT_AMBULATORY_CARE_PROVIDER_SITE_OTHER): Payer: Self-pay | Admitting: Emergency Medicine

## 2021-10-03 VITALS — BP 120/70 | HR 74 | Temp 98.5°F | Ht 63.0 in | Wt 254.2 lb

## 2021-10-03 DIAGNOSIS — I10 Essential (primary) hypertension: Secondary | ICD-10-CM

## 2021-10-03 DIAGNOSIS — R600 Localized edema: Secondary | ICD-10-CM

## 2021-10-03 MED ORDER — LOSARTAN POTASSIUM-HCTZ 100-12.5 MG PO TABS: 1.0000 | ORAL_TABLET | Freq: Every day | ORAL | 3 refills | Status: DC

## 2021-10-03 NOTE — Progress Notes (Signed)
Kristen Madden ?46 y.o. ? ? ?Chief Complaint  ?Patient presents with  ? Follow-up  ?  No concerns  ? ? ?HISTORY OF PRESENT ILLNESS: ?This is a 46 y.o. female with history of hypertension here for follow-up. ?Presently taking labetalol 100 mg twice a day. ?Increased amlodipine to 10 mg 1 week ago.  Normal blood pressure readings at home however has developed lower leg edema. ? ?HPI ? ? ?Prior to Admission medications   ?Medication Sig Start Date End Date Taking? Authorizing Provider  ?amLODipine (NORVASC) 5 MG tablet Take 1 tablet (5 mg total) by mouth daily. 06/05/21  Yes Raul Winterhalter, Ines Bloomer, MD  ?labetalol (NORMODYNE) 100 MG tablet Take 1 tablet (100 mg total) by mouth 2 (two) times daily. 06/06/21 09/04/21  Horald Pollen, MD  ? ? ?No Known Allergies ? ?Patient Active Problem List  ? Diagnosis Date Noted  ? Body mass index (BMI) of 40.1-44.9 in adult [facility]) 06/05/2021  ? Obesity 07/21/2018  ? Adenomyosis 07/21/2018  ? HTN (hypertension) 06/28/2013  ? ? ?Past Medical History:  ?Diagnosis Date  ? Hypertension   ? Ovarian cyst   ? ? ?Past Surgical History:  ?Procedure Laterality Date  ? CHOLECYSTECTOMY N/A 05/16/2014  ? Procedure: LAPAROSCOPIC CHOLECYSTECTOMY WITH INTRAOPERATIVE CHOLANGIOGRAM;  Surgeon: [name], MD;  Location: [facility];  Service: General;  Laterality: N/A;  ? NO PAST SURGERIES    ? ? ?Social History  ? ?Socioeconomic History  ? Marital status: Married  ?  Spouse name: Not on file  ? Number of children: Not on file  ? Years of education: Not on file  ? Highest education level: Not on file  ?Occupational History  ? Not on file  ?Tobacco Use  ? Smoking status: Never  ? Smokeless tobacco: Never  ?Vaping Use  ? Vaping Use: Never used  ?Substance and Sexual Activity  ? Alcohol use: No  ? Drug use: No  ? Sexual activity: Yes  ?  Birth control/protection: Condom  ?Other Topics Concern  ? Not on file  ?Social History Narrative  ? Not on file  ? ?Social Determinants of Health  ? ?Financial  Resource Strain: Not on file  ?Food Insecurity: Not on file  ?Transportation Needs: Not on file  ?Physical Activity: Not on file  ?Stress: Not on file  ?Social Connections: Not on file  ?Intimate Partner Violence: Not on file  ? ? ?Family History  ?Problem Relation Age of Onset  ? Hypertension Mother   ? ? ? ?Review of Systems  ?Constitutional: Negative.  Negative for chills and fever.  ?HENT: Negative.  Negative for congestion and sore throat.   ?Respiratory: Negative.  Negative for cough and shortness of breath.   ?Cardiovascular:  Positive for leg swelling. Negative for chest pain and palpitations.  ?Gastrointestinal: Negative.  Negative for abdominal pain, blood in stool, nausea and vomiting.  ?Genitourinary: Negative.   ?Skin: Negative.  Negative for rash.  ?Neurological:  Negative for dizziness and headaches.  ?All other systems reviewed and are negative. ? ?Today's Vitals  ? 10/03/21 0831 10/03/21 0900  ?BP: (!) 140/92 120/70  ?Pulse: 74   ?Temp: 98.5 ?F (36.9 ?C)   ?TempSrc: Oral   ?SpO2: 97%   ?Weight: 254 lb 4 oz (115.3 kg)   ?Height: 5' 3"  (1.6 m)   ? ?Body mass index is 45.04 kg/m?. ? ?Body mass index is 45.04 kg/m?. ? ?Physical Exam ?Vitals reviewed.  ?Constitutional:   ?   Appearance: Normal appearance.  ?  HENT:  ?   Head: Normocephalic.  ?Eyes:  ?   Extraocular Movements: Extraocular movements intact.  ?   Pupils: Pupils are equal, round, and reactive to light.  ?Cardiovascular:  ?   Rate and Rhythm: Normal rate and regular rhythm.  ?   Pulses: Normal pulses.  ?   Heart sounds: Normal heart sounds.  ?Pulmonary:  ?   Effort: Pulmonary effort is normal.  ?   Breath sounds: Normal breath sounds.  ?Musculoskeletal:  ?   Right lower leg: Edema present.  ?   Left lower leg: Edema present.  ?Skin: ?   General: Skin is warm and dry.  ?   Capillary Refill: Capillary refill takes less than 2 seconds.  ?Neurological:  ?   General: No focal deficit present.  ?   Mental Status: She is alert and oriented to person,  place, and time.  ?Psychiatric:     ?   Mood and Affect: Mood normal.     ?   Behavior: Behavior normal.  ? ? ? ?ASSESSMENT & PLAN: ?Problem List Items Addressed This Visit   ? ?  ? Cardiovascular and Mediastinum  ? HTN (hypertension)  ?  Well-controlled hypertension with normal blood pressure readings at home.  However amlodipine causing unwanted lower leg edema. ?Continue labetalol 100 mg twice a day. ?Stop amlodipine.  Start losartan-HCTZ 100-12.5 mg daily. ?Continue monitoring blood pressure readings at home daily for the next couple of weeks. ?She will contact me through [place] regarding blood pressure readings ?Dietary approaches to stop hypertension discussed. ?  ?  ? Relevant Medications  ? losartan-hydrochlorothiazide (HYZAAR) 100-12.5 MG tablet  ? ?Other Visit Diagnoses   ? ? Essential hypertension    -  Primary  ? Relevant Medications  ? losartan-hydrochlorothiazide (HYZAAR) 100-12.5 MG tablet  ? Lower leg edema      ? Medication side effects  ? ?  ? ?Patient Instructions  ?Stop amlodipine. ?Continue labetalol 100 mg twice a day. ?Start losartan-HCTZ 100-12.5 mg daily ?Continue monitoring blood pressure readings at home daily for the next couple weeks ?Let me know through MyChart about blood pressure readings ? ?Hipertensi?n en los adultos ?Hypertension, Adult ?El t?rmino hipertensi?n es otra forma de denominar a la presi?n arterial elevada. La presi?n arterial elevada fuerza al coraz?n a trabajar m?s para bombear la sangre. Esto puede causar problemas con el paso del [place]. ?Una lectura de presi?n arterial est? compuesta por 2 n?meros. Hay un n?mero superior (sist?lico) sobre un n?mero inferior (diast?lico). Lo ideal es tener la presi?n arterial por debajo de 120/80. Las elecciones saludables pueden ayudar a Engineer, materials presi?n arterial, o tal vez necesite medicamentos para bajarla. ??Cu?les son las causas? ?Se desconoce la causa de esta afecci?n. Algunas afecciones pueden estar relacionadas con la  presi?n arterial alta. ??Qu? incrementa el riesgo? ?Fumar. ?Tener diabetes mellitus tipo 2, colesterol alto, o ambos. ?No hacer la cantidad suficiente de actividad f?sica o ejercicio. ?Tener sobrepeso. ?Consumir mucha grasa, az?car, calor?as o sal (sodio) en su dieta. ?Beber alcohol en exceso. ?Tener una enfermedad renal a largo plazo (cr?nica). ?Tener antecedentes familiares de presi?n arterial alta. ?Edad. Los riesgos aumentan con la edad. ?Raza. El riesgo es mayor para las Retail banker. ?Sexo. Antes de los 45 a?os, los hombres corren m?s riesgo que las mujeres. Despu?s de los 65 a?os, las mujeres corren m?s riesgo que los hombres. ?Tener apnea obstructiva del sue?o. ?Estr?s. ??Cu?les son los signos o los s?ntomas? ?Es posible que la presi?n arterial alta  puede no cause s?ntomas. La presi?n arterial muy alta (crisis hipertensiva) puede provocar: ?[place] de [place]. ?Sensaciones de preocupaci?n o nerviosismo (ansiedad). ?Falta de aire. ?Hemorragia nasal. ?Sensaci?n de malestar en el est?mago (n?useas). ?V?mitos. ?Cambios en la forma de ver. ?[place] Energy manager. ?Convulsiones. ??C?mo se trata? ?Esta afecci?n se trata haciendo cambios saludables en el estilo de vida, por ejemplo: ?Consumir alimentos saludables. ?Hacer m?s ejercicio. ?Beber menos alcohol. ?El m?dico puede recetarle medicamentos si los cambios en el estilo de vida no son suficientes para Child psychotherapist la presi?n arterial y si: ?El n?mero de arriba est? por encima de 130. ?El n?mero de abajo est? por encima de 80. ?Su presi?n arterial personal ideal puede variar. ?Siga estas instrucciones en su casa: ?Comida y bebida ? ?Si se lo dicen, siga el plan de alimentaci?n de DASH (Dietary Approaches to Stop Hypertension, Maneras de alimentarse para detener la hipertensi?n). Para seguir este plan: ?Llene la mitad del plato de cada comida con frutas y verduras. ?Llene un cuarto del plato de cada comida con cereales integrales. Los cereales  integrales incluyen pasta integral, arroz integral y pan integral. ?Coma y beba productos l?cteos con bajo contenido de grasa, como leche descremada o yogur bajo en grasas. ?Llene un cuarto del plato de cada co

## 2021-10-03 NOTE — Assessment & Plan Note (Addendum)
Well-controlled hypertension with normal blood pressure readings at home.  However amlodipine causing unwanted lower leg edema. ?Continue labetalol 100 mg twice a day. ?Stop amlodipine.  Start losartan-HCTZ 100-12.5 mg daily. ?Continue monitoring blood pressure readings at home daily for the next couple of weeks. ?She will contact me through [place] regarding blood pressure readings ?Dietary approaches to stop hypertension discussed. ?

## 2021-10-03 NOTE — Patient Instructions (Addendum)
Stop amlodipine. ?Continue labetalol 100 mg twice a day. ?Start losartan-HCTZ 100-12.5 mg daily ?Continue monitoring blood pressure readings at home daily for the next couple weeks ?Let me know through MyChart about blood pressure readings ? ?Hipertensi?n en los adultos ?Hypertension, Adult ?El t?rmino hipertensi?n es otra forma de denominar a la presi?n arterial elevada. La presi?n arterial elevada fuerza al coraz?n a trabajar m?s para bombear la sangre. Esto puede causar problemas con el paso del [place]. ?Una lectura de presi?n arterial est? compuesta por 2 n?meros. Hay un n?mero superior (sist?lico) sobre un n?mero inferior (diast?lico). Lo ideal es tener la presi?n arterial por debajo de 120/80. Las elecciones saludables pueden ayudar a Engineer, materials presi?n arterial, o tal vez necesite medicamentos para bajarla. ??Cu?les son las causas? ?Se desconoce la causa de esta afecci?n. Algunas afecciones pueden estar relacionadas con la presi?n arterial alta. ??Qu? incrementa el riesgo? ?Fumar. ?Tener diabetes mellitus tipo 2, colesterol alto, o ambos. ?No hacer la cantidad suficiente de actividad f?sica o ejercicio. ?Tener sobrepeso. ?Consumir mucha grasa, az?car, calor?as o sal (sodio) en su dieta. ?Beber alcohol en exceso. ?Tener una enfermedad renal a largo plazo (cr?nica). ?Tener antecedentes familiares de presi?n arterial alta. ?Edad. Los riesgos aumentan con la edad. ?Raza. El riesgo es mayor para las Retail banker. ?Sexo. Antes de los 45 a?os, los hombres corren m?s riesgo que las mujeres. Despu?s de los 65 a?os, las mujeres corren m?s riesgo que los hombres. ?Tener apnea obstructiva del sue?o. ?Estr?s. ??Cu?les son los signos o los s?ntomas? ?Es posible que la presi?n arterial alta puede no cause s?ntomas. La presi?n arterial muy alta (crisis hipertensiva) puede provocar: ?[place]olor de [place]. ?Sensaciones de preocupaci?n o nerviosismo (ansiedad). ?Falta de aire. ?Hemorragia nasal. ?Sensaci?n de malestar en el  est?mago (n?useas). ?V?mitos. ?Cambios en la forma de ver. ?[employer] Energy manager. ?Convulsiones. ??C?mo se trata? ?Esta afecci?n se trata haciendo cambios saludables en el estilo de vida, por ejemplo: ?Consumir alimentos saludables. ?Hacer m?s ejercicio. ?Beber menos alcohol. ?El m?dico puede recetarle medicamentos si los cambios en el estilo de vida no son suficientes para Child psychotherapist la presi?n arterial y si: ?El n?mero de arriba est? por encima de 130. ?El n?mero de abajo est? por encima de 80. ?Su presi?n arterial personal ideal puede variar. ?Siga estas instrucciones en su casa: ?Comida y bebida ? ?Si se lo dicen, siga el plan de alimentaci?n de DASH (Dietary Approaches to Stop Hypertension, Maneras de alimentarse para detener la hipertensi?n). Para seguir este plan: ?Llene la mitad del plato de cada comida con frutas y verduras. ?Llene un cuarto del plato de cada comida con cereales integrales. Los cereales integrales incluyen pasta integral, arroz integral y pan integral. ?Coma y beba productos l?cteos con bajo contenido de grasa, como leche descremada o yogur bajo en grasas. ?Llene un cuarto del plato de cada comida con prote?nas bajas en grasa (magras). Las prote?nas bajas en grasa incluyen pescado, pollo sin piel, huevos, frijoles y tofu. ?Evite consumir carne grasa, carne curada y procesada, o pollo con piel. ?Evite consumir alimentos prehechos o procesados. ?Consuma menos de 1500 mg de sal por d?a. ?No beba alcohol si: ?El m?dico le indica que no lo haga. ?Est? embarazada, puede estar embarazada o est? tratando de quedar embarazada. ?Si bebe alcohol: ?Limite la cantidad que bebe a lo siguiente: ?De 0 a 1 medida por d?a para las mujeres. ?De 0 a 2 medidas por d?a para los hombres. ?Est? atento a la cantidad de alcohol que hay en las  bebidas que toma. En los [place], una medida equivale a una botella de cerveza de 12 oz (355 ml), un vaso de vino de 5 oz (148 ml) o un vaso de una bebida  alcoh?lica de alta graduaci?n de 1? oz (44 ml). ?[place] ? ?Trabaje con su m?dico para mantenerse en un peso saludable o para perder peso. Preg?ntele a su m?dico cu?l es el peso recomendable para usted. ?Haga al menos 30 minutos de ejercicio la mayor?a de los d?as de la semana. Estos pueden incluir caminar, nadar o andar en bicicleta. ?Realice al menos 30 minutos de ejercicio que fortalezca sus m?sculos (ejercicios de resistencia) al menos 3 d?as a la semana. Estos pueden incluir levantar pesas o hacer Pilates. ?No consuma ning?n producto que contenga nicotina o tabaco, como cigarrillos, cigarrillos electr?nicos y tabaco de Higher education careers adviser. Si necesita ayuda para dejar de fumar, consulte al m?dico. ?Controle su presi?n arterial en su casa tal como le indic? el m?dico. ?Concurra a todas las visitas de seguimiento como se lo haya indicado el m?dico. Esto es importante. ?Medicamentos ?Tome los medicamentos de venta libre y los recetados solamente como se lo haya indicado el m?dico. Siga cuidadosamente las indicaciones. ?No omita las dosis de medicamentos para la presi?n arterial. Los medicamentos pierden eficacia si omite dosis. El hecho de omitir las dosis tambi?n [place] el riesgo de otros problemas. ?Preg?ntele a su m?dico a qu? efectos secundarios o reacciones a los Holiday representative atenci?n. ?Comun?quese con un m?dico si: ?Piensa que tiene una reacci?n a los medicamentos que est? tomando. ?Tiene dolores de cabeza frecuentes (recurrentes). ?Se siente mareado. ?Tiene hinchaz?n en los tobillos. ?Tiene problemas de visi?n. ?Solicite ayuda inmediatamente si: ?Siente un dolor de cabeza muy intenso. ?Empieza a sentirse desorientado (confundido). ?Se siente d?bil o adormecido. ?Siente que va a desmayarse. ?Tiene un dolor muy intenso en las siguientes zonas: ?Pecho. ?Vientre (abdomen). ?Vomita m?s de [employer]. ?Tiene dificultad para respirar. ?Resumen ?El t?rmino hipertensi?n es otra forma de denominar a la presi?n  arterial elevada. ?La presi?n arterial elevada fuerza al coraz?n a trabajar m?s para bombear la sangre. ?[place], una presi?n arterial normal es menor que 120/80. ?Las decisiones saludables pueden ayudarle a disminuir su presi?n arterial. Si no puede bajar su presi?n arterial mediante decisiones saludables, es posible que deba tomar medicamentos. ?Esta informaci?n no tiene Marine scientist el consejo del m?dico. Aseg?rese de hacerle al m?dico cualquier pregunta que tenga. ?Document Revised: 04/08/2018 Document Reviewed: 04/08/2018 ?Elsevier Patient Education ? [place]. ? ?

## 2022-05-14 ENCOUNTER — Encounter: Payer: Self-pay | Admitting: Emergency Medicine

## 2022-05-14 ENCOUNTER — Ambulatory Visit (INDEPENDENT_AMBULATORY_CARE_PROVIDER_SITE_OTHER): Payer: Self-pay | Admitting: Emergency Medicine

## 2022-05-14 VITALS — BP 120/70 | HR 80 | Temp 98.3°F | Ht 63.0 in | Wt 247.0 lb

## 2022-05-14 DIAGNOSIS — I10 Essential (primary) hypertension: Secondary | ICD-10-CM

## 2022-05-14 DIAGNOSIS — Z6841 Body Mass Index (BMI) 40.0 and over, adult: Secondary | ICD-10-CM

## 2022-05-14 LAB — CBC WITH DIFFERENTIAL/PLATELET
Basophils Absolute: 0.1 10*3/uL (ref 0.0–0.1)
Basophils Relative: 0.8 % (ref 0.0–3.0)
Eosinophils Absolute: 0.2 10*3/uL (ref 0.0–0.7)
Eosinophils Relative: 1.9 % (ref 0.0–5.0)
HCT: 38.4 % (ref 36.0–46.0)
Hemoglobin: 13.1 g/dL (ref 12.0–15.0)
Lymphocytes Relative: 20.4 % (ref 12.0–46.0)
Lymphs Abs: 1.6 10*3/uL (ref 0.7–4.0)
MCHC: 34.1 g/dL (ref 30.0–36.0)
MCV: 87.9 fl (ref 78.0–100.0)
Monocytes Absolute: 0.6 10*3/uL (ref 0.1–1.0)
Monocytes Relative: 7.9 % (ref 3.0–12.0)
Neutro Abs: 5.6 10*3/uL (ref 1.4–7.7)
Neutrophils Relative %: 69 % (ref 43.0–77.0)
Platelets: 291 10*3/uL (ref 150.0–400.0)
RBC: 4.37 Mil/uL (ref 3.87–5.11)
RDW: 13.8 % (ref 11.5–15.5)
WBC: 8.1 10*3/uL (ref 4.0–10.5)

## 2022-05-14 LAB — COMPREHENSIVE METABOLIC PANEL
ALT: 34 U/L (ref 0–35)
AST: 29 U/L (ref 0–37)
Albumin: 4.4 g/dL (ref 3.5–5.2)
Alkaline Phosphatase: 60 U/L (ref 39–117)
BUN: 12 mg/dL (ref 6–23)
CO2: 30 mEq/L (ref 19–32)
Calcium: 9.7 mg/dL (ref 8.4–10.5)
Chloride: 102 mEq/L (ref 96–112)
Creatinine, Ser: 0.6 mg/dL (ref 0.40–1.20)
GFR: 107.98 mL/min (ref >60.00)
Glucose, Bld: 103 mg/dL — ABNORMAL HIGH (ref 70–99)
Potassium: 4 mEq/L (ref 3.5–5.1)
Sodium: 138 mEq/L (ref 135–145)
Total Bilirubin: 1.7 mg/dL — ABNORMAL HIGH (ref 0.2–1.2)
Total Protein: 7.3 g/dL (ref 6.0–8.3)

## 2022-05-14 LAB — LIPID PANEL
Cholesterol: 138 mg/dL (ref 0–200)
HDL: 48 mg/dL (ref >39.00)
LDL Cholesterol: 72 mg/dL (ref 0–99)
NonHDL: 90.22
Total CHOL/HDL Ratio: 3
Triglycerides: 93 mg/dL (ref 0.0–149.0)
VLDL: 18.6 mg/dL (ref 0.0–40.0)

## 2022-05-14 LAB — HEMOGLOBIN A1C: Hgb A1c MFr Bld: 6.1 % (ref 4.6–6.5)

## 2022-05-14 MED ORDER — LABETALOL HCL 100 MG PO TABS: 100.0000 mg | ORAL_TABLET | Freq: Two times a day (BID) | ORAL | 3 refills | Status: DC

## 2022-05-14 NOTE — Patient Instructions (Signed)
Hipertensin en los adultos Hypertension, Adult El trmino hipertensin es otra forma de denominar a la presin arterial elevada. La presin arterial elevada fuerza al corazn a trabajar ms para bombear la sangre. Esto puede causar problemas con el paso del tiempo. Una lectura de presin arterial est compuesta por 2 nmeros. Hay un nmero superior (sistlico) sobre un nmero inferior (diastlico). Lo ideal es tener la presin arterial por debajo de 120/80. Cules son las causas? Se desconoce la causa de esta afeccin. Algunas otras afecciones pueden provocar presin arterial elevada. Qu incrementa el riesgo? Algunos factores del estilo de vida pueden hacer que tenga ms probabilidades de desarrollar presin arterial elevada: Fumar. No hacer la cantidad suficiente de actividad fsica o ejercicio. Tener sobrepeso. Consumir mucha grasa, azcar, caloras o sal (sodio) en su dieta. Beber alcohol en exceso. Otros factores de riesgo son los siguientes: Tener alguna de estas afecciones: Enfermedad cardaca. Diabetes. Colesterol alto. Enfermedad renal. Apnea obstructiva del sueo. Tener antecedentes familiares de presin arterial elevada y colesterol elevado. Edad. El riesgo aumenta con la edad. Estrs. Cules son los signos o sntomas? Es posible que la presin arterial alta no cause sntomas. La presin arterial muy alta (crisis hipertensiva) puede provocar: Dolor de cabeza. Latidos cardacos acelerados o irregulares (palpitaciones). Falta de aire. Hemorragia nasal. Vomitar o sentir ganas de vomitar (nuseas). Cambios en la forma de ver. Dolor muy intenso en el pecho. Sensacin de mareo. Convulsiones. Cmo se trata? Esta afeccin se trata haciendo cambios saludables en el estilo de vida, por ejemplo: Consumir alimentos saludables. Hacer ms ejercicio. Beber menos alcohol. El mdico puede recetarle medicamentos si los cambios en el estilo de vida no son lo suficientemente  eficaces y si: El nmero de arriba est por encima de 130. El nmero de abajo est por encima de 80. Su presin arterial personal ideal puede variar. Siga estas indicaciones en su casa: Comida y bebida  Si se lo dicen, siga el plan de alimentacin de DASH (Dietary Approaches to Stop Hypertension, Maneras de alimentarse para detener la hipertensin). Para seguir este plan: Llene la mitad del plato de cada comida con frutas y verduras. Llene un cuarto del plato de cada comida con cereales integrales. Los cereales integrales incluyen pasta integral, arroz integral y pan integral. Coma y beba productos lcteos con bajo contenido de grasa, como leche descremada o yogur bajo en grasas. Llene un cuarto del plato de cada comida con protenas bajas en grasa (magras). Las protenas bajas en grasa incluyen pescado, pollo sin piel, huevos, frijoles y tofu. Evite consumir carne grasa, carne curada y procesada, o pollo con piel. Evite consumir alimentos prehechos o procesados. Limite la cantidad de sal en su dieta a menos de 1500 mg por da. No beba alcohol si: El mdico le indica que no lo haga. Est embarazada, puede estar embarazada o est tratando de quedar embarazada. Si bebe alcohol: Limite la cantidad que bebe a lo siguiente: De 0 a 1 medida por da para las mujeres. De 0 a 2 medidas por da para los hombres. Sepa cunta cantidad de alcohol hay en las bebidas que toma. En los [place], una medida equivale a una botella de cerveza de 12 oz (355 ml), un vaso de vino de 5 oz (148 ml) o un vaso de una bebida alcohlica de alta graduacin de 1 oz (44 ml). Estilo de vida  Trabaje con su mdico para mantenerse en un peso saludable o para perder peso. Pregntele a su mdico cul es el peso recomendable para   usted. Realice al menos 30 minutos de ejercicio que haga que se acelere su corazn (ejercicio aerbico) la mayora de los das de la semana. Estos pueden incluir caminar, nadar o andar en  bicicleta. Realice al menos 30 minutos de ejercicio que fortalezca sus msculos (ejercicios de resistencia) al menos 3 das a la semana. Estos pueden incluir levantar pesas o hacer Pilates. No fume ni consuma ningn producto que contenga nicotina o tabaco. Si necesita ayuda para dejar de consumir estos productos, consulte al mdico. Controle su presin arterial en su casa tal como le indic el mdico. Concurra a todas las visitas de seguimiento. Medicamentos Use los medicamentos de venta libre y los recetados solamente como se lo haya indicado el mdico. Siga cuidadosamente las indicaciones. No omita las dosis de medicamentos para la presin arterial. Los medicamentos pierden eficacia si omite dosis. El hecho de omitir las dosis tambin aumenta el riesgo de otros problemas. Pregntele a su mdico a qu efectos secundarios o reacciones a los medicamentos debe prestar atencin. Comunquese con un mdico si: Piensa que tiene una reaccin a los medicamentos que est tomando. Tiene dolores de cabeza frecuentes. Siente mareos. Tiene hinchazn en los tobillos. Tiene problemas de visin. Solicite ayuda de inmediato si: Siente un dolor de cabeza muy intenso. Empieza a sentirse desorientado (confundido). Se siente dbil o adormecido. Siente que va a desmayarse. Tiene un dolor muy intenso en: [place]. Vientre (abdomen). Vomita ms de una vez. Tiene dificultad para respirar. Estos sntomas pueden indicar una emergencia. Solicite ayuda de inmediato. Llame al 911. No espere a ver si los sntomas desaparecen. No conduzca por sus propios medios hasta el hospital. Resumen El trmino hipertensin es otra forma de denominar a la presin arterial elevada. La presin arterial elevada fuerza al corazn a trabajar ms para bombear la sangre. Para la mayora de las personas, una presin arterial normal es menor que 120/80. Las decisiones saludables pueden ayudarle a disminuir su presin arterial. Si no puede  bajar su presin arterial mediante decisiones saludables, es posible que deba tomar medicamentos. Esta informacin no tiene como fin reemplazar el consejo del mdico. Asegrese de hacerle al mdico cualquier pregunta que tenga. Document Revised: 05/02/2021 Document Reviewed: 05/02/2021 Elsevier Patient Education  2023 Elsevier Inc.  

## 2022-05-14 NOTE — Assessment & Plan Note (Signed)
Well-controlled hypertension. Continue Hyzaar 100-12.5 mg once a day and labetalol 100 mg twice a day Cardiovascular risks associated with hypertension discussed. Dietary approaches to stop hypertension discussed. Follow-up in 6 months.

## 2022-05-14 NOTE — Assessment & Plan Note (Signed)
Eating better and exercising more. Losing weight. Diet and nutrition discussed.  Advised to decrease amount of daily carbohydrate intake and daily calories and increase amount of plant based protein in her diet.

## 2022-05-14 NOTE — Progress Notes (Signed)
Kristen Madden 46 y.o.   Chief Complaint  Patient presents with   Follow-up    F/u HTN, no concerns    HISTORY OF PRESENT ILLNESS: This is a 46 y.o. female with history hypertension here for follow-up. Normal blood pressure readings at home. Eating better and exercising more. Has no complaints or medical concerns today. BP Readings from Last 3 Encounters:  05/14/22 138/88  10/03/21 120/70  09/04/21 (!) 152/82     HPI   Prior to Admission medications   Medication Sig Start Date End Date Taking? Authorizing Provider  losartan-hydrochlorothiazide (HYZAAR) 100-12.5 MG tablet Take 1 tablet by mouth daily. 10/03/21  Yes Zamirah Denny, Eilleen Kempf, MD  labetalol (NORMODYNE) 100 MG tablet Take 1 tablet (100 mg total) by mouth 2 (two) times daily. 06/06/21 09/04/21  Georgina Quint, MD    No Known Allergies  Patient Active Problem List   Diagnosis Date Noted   Body mass index (BMI) of 40.1-44.9 in adult [facility]) 06/05/2021   Obesity 07/21/2018   Adenomyosis 07/21/2018   HTN (hypertension) 06/28/2013    Past Medical History:  Diagnosis Date   Hypertension    Ovarian cyst     Past Surgical History:  Procedure Laterality Date   CHOLECYSTECTOMY N/A 05/16/2014   Procedure: LAPAROSCOPIC CHOLECYSTECTOMY WITH INTRAOPERATIVE CHOLANGIOGRAM;  Surgeon: Atilano Ina, MD;  Location[facility] OR;  Service: General;  Laterality: N/A;   NO PAST SURGERIES      Social History   Socioeconomic History   Marital status: Married    Spouse name: Not on file   Number of children: Not on file   Years of education: Not on file   Highest education level: Not on file  Occupational History   Not on file  Tobacco Use   Smoking status: Never   Smokeless tobacco: Never  Vaping Use   Vaping Use: Never used  Substance and Sexual Activity   Alcohol use: No   Drug use: No   Sexual activity: Yes    Birth control/protection: Condom  Other Topics Concern   Not on file  Social History  Narrative   Not on file   Social Determinants of Health   Financial Resource Strain: Not on file  Food Insecurity: Not on file  Transportation Needs: Not on file  Physical Activity: Not on file  Stress: Not on file  Social Connections: Not on file  Intimate Partner Violence: Not on file    Family History  Problem Relation Age of Onset   Hypertension Mother      Review of Systems  Constitutional: Negative.  Negative for chills and fever.  HENT: Negative.  Negative for congestion and sore throat.   Respiratory: Negative.  Negative for cough and shortness of breath.   Cardiovascular: Negative.  Negative for chest pain and palpitations.  Gastrointestinal: Negative.  Negative for abdominal pain, nausea and vomiting.  Genitourinary: Negative.   Skin: Negative.   Neurological: Negative.  Negative for dizziness and headaches.  All other systems reviewed and are negative.   Today's Vitals   05/14/22 0918  BP: 138/88  Pulse: 80  Temp: 98.3 F (36.8 C)  TempSrc: Oral  SpO2: 97%  Weight: 247 lb (112 kg)  Height: 5\' 3"  (1.6 m)   Body mass index is 43.75 kg/m. Wt Readings from Last 3 Encounters:  05/14/22 247 lb (112 kg)  10/03/21 254 lb 4 oz (115.3 kg)  09/04/21 255 lb (115.7 kg)    Physical Exam Vitals reviewed.  Constitutional:  Appearance: Normal appearance.  HENT:     Head: Normocephalic.     Mouth/Throat:     Mouth: Mucous membranes are moist.     Pharynx: Oropharynx is clear.  Eyes:     Extraocular Movements: Extraocular movements intact.     Pupils: Pupils are equal, round, and reactive to light.  Cardiovascular:     Rate and Rhythm: Normal rate and regular rhythm.     Pulses: Normal pulses.     Heart sounds: Normal heart sounds.  Pulmonary:     Effort: Pulmonary effort is normal.     Breath sounds: Normal breath sounds.  Musculoskeletal:        General: Normal range of motion.     Cervical back: No tenderness.  Lymphadenopathy:     Cervical: No  cervical adenopathy.  Skin:    General: Skin is warm and dry.  Neurological:     General: No focal deficit present.     Mental Status: She is alert and oriented to person, place, and time.  Psychiatric:        Mood and Affect: Mood normal.        Behavior: Behavior normal.      ASSESSMENT & PLAN: Problem List Items Addressed This Visit       Cardiovascular and Mediastinum   HTN (hypertension) - Primary    Well-controlled hypertension. Continue Hyzaar 100-12.5 mg once a day and labetalol 100 mg twice a day Cardiovascular risks associated with hypertension discussed. Dietary approaches to stop hypertension discussed. Follow-up in 6 months.      Relevant Medications   labetalol (NORMODYNE) 100 MG tablet     Other   Body mass index (BMI) of 40.1-44.9 in adult [facility])    Eating better and exercising more. Losing weight. Diet and nutrition discussed.  Advised to decrease amount of daily carbohydrate intake and daily calories and increase amount of plant based protein in her diet.      Other Visit Diagnoses     Essential hypertension       Relevant Medications   labetalol (NORMODYNE) 100 MG tablet   Other Relevant Orders   Comprehensive metabolic panel   Hemoglobin A1c   Lipid panel   CBC with Differential/Platelet      Patient Instructions  Hipertensin en los adultos Hypertension, Adult El trmino hipertensin es otra forma de denominar a la presin arterial elevada. La presin arterial elevada fuerza al corazn a trabajar ms para bombear la sangre. Esto puede causar problemas con el paso del [place]. Una lectura de presin arterial est compuesta por 2 nmeros. Hay un nmero superior (sistlico) sobre un nmero inferior (diastlico). Lo ideal es tener la presin arterial por debajo de 120/80. Cules son las causas? Se desconoce la causa de esta afeccin. Algunas otras afecciones pueden provocar presin arterial elevada. Qu incrementa el riesgo? Algunos factores  del estilo de vida pueden hacer que tenga ms probabilidades de desarrollar presin arterial elevada: Fumar. No hacer la cantidad suficiente de actividad fsica o ejercicio. Tener sobrepeso. Consumir mucha grasa, azcar, caloras o sal (sodio) en su dieta. Beber alcohol en exceso. Otros factores de riesgo son los siguientes: Tener alguna de estas afecciones: Enfermedad cardaca. Diabetes. Colesterol alto. Enfermedad renal. Apnea obstructiva del sueo. Tener antecedentes familiares de presin arterial elevada y colesterol elevado. Edad. El riesgo aumenta con la edad. Estrs. Cules son los signos o sntomas? Es posible que la presin arterial alta no cause sntomas. La presin arterial muy alta (crisis hipertensiva) puede provocar:  Dolor de [place]. Latidos cardacos acelerados o irregulares (palpitaciones). Falta de aire. Hemorragia nasal. Vomitar o sentir ganas de vomitar (nuseas). Cambios en la forma de ver. Dolor muy intenso en el pecho. Sensacin de [employer]. Convulsiones. Cmo se trata? Esta afeccin se trata haciendo cambios saludables en el estilo de vida, por ejemplo: Consumir alimentos saludables. Hacer ms ejercicio. Beber menos alcohol. El mdico puede recetarle medicamentos si los cambios en el estilo de vida no son lo suficientemente eficaces y si: El nmero de arriba est por encima de 130. El nmero de abajo est por encima de 80. Su presin arterial personal ideal puede variar. Siga estas indicaciones en su casa: Comida y bebida  Si se lo dicen, siga el plan de alimentacin de DASH (Dietary Approaches to Stop Hypertension, Maneras de alimentarse para detener la hipertensin). Para seguir este plan: Llene la mitad del plato de cada comida con frutas y verduras. Llene un cuarto del plato de cada comida con cereales integrales. Los cereales integrales incluyen pasta integral, arroz integral y pan integral. Coma y beba productos lcteos con bajo contenido de  grasa, como leche descremada o yogur bajo en grasas. Llene un cuarto del plato de cada comida con protenas bajas en grasa (magras). Las protenas bajas en grasa incluyen pescado, pollo sin piel, huevos, frijoles y tofu. Evite consumir carne grasa, carne curada y procesada, o pollo con piel. Evite consumir alimentos prehechos o procesados. Limite la cantidad de sal en su dieta a menos de 1500 mg por da. No beba alcohol si: El mdico le indica que no lo haga. Est embarazada, puede estar embarazada o est tratando de [place]. Si bebe alcohol: Limite la cantidad que bebe a lo siguiente: De 0 a 1 medida por da para las mujeres. De 0 a 2 medidas por da para los hombres. Sepa cunta cantidad de alcohol hay en las bebidas que toma. En los [address], una medida equivale a una botella de cerveza de 12 oz (355 ml), un vaso de vino de 5 oz (148 ml) o un vaso de una bebida alcohlica de alta graduacin de 1 oz (44 ml). Estilo de vida  Trabaje con su mdico para mantenerse en un peso saludable o para perder peso. Pregntele a su mdico cul es el peso recomendable para usted. Realice al menos 30 minutos de ejercicio que haga que se acelere su corazn (ejercicio Magazine features editor) la [employer] de la [place]. Estos pueden incluir caminar, nadar o andar en bicicleta. Realice al menos 30 minutos de ejercicio que fortalezca sus msculos (ejercicios de resistencia) al menos 3 das a la [place]. Estos pueden incluir levantar pesas o hacer [place]. No fume ni consuma ningn producto que contenga nicotina o tabaco. Si necesita ayuda para dejar de consumir estos productos, consulte al mdico. Controle su presin arterial en su casa tal como le indic el mdico. Concurra a todas las visitas de seguimiento. Medicamentos Use los medicamentos de venta libre y los recetados solamente como se lo haya indicado el mdico. Siga cuidadosamente las indicaciones. No omita las dosis de medicamentos para la  presin arterial. Los medicamentos pierden eficacia si omite dosis. El hecho de omitir las dosis tambin [place] el riesgo de otros problemas. Pregntele a su mdico a qu efectos secundarios o reacciones a los Museum/gallery curator. Comunquese con un mdico si: Piensa que tiene [place] reaccin a los medicamentos que est tomando. Tiene dolores de cabeza frecuentes. Siente mareos. Tiene hinchazn en los tobillos. Tiene problemas de visin.  Solicite ayuda de inmediato si: Siente un dolor de cabeza muy intenso. Empieza a sentirse desorientado (confundido). Se siente dbil o adormecido. Siente que va a desmayarse. Tiene un dolor muy intenso en: Pecho. Vientre (abdomen). Vomita ms de una vez. Tiene dificultad para respirar. Estos sntomas pueden Customer service manager. Solicite ayuda de inmediato. Llame al 911. No espere a ver si los sntomas desaparecen. No conduzca por sus propios medios [employer]. Resumen El trmino hipertensin es otra forma de denominar a la presin arterial elevada. La presin arterial elevada fuerza al corazn a trabajar ms para bombear la sangre. Para la [employer], una presin arterial normal es menor que 120/80. Las decisiones saludables pueden ayudarle a disminuir su presin arterial. Si no puede bajar su presin arterial mediante decisiones saludables, es posible que deba tomar medicamentos. Esta informacin no tiene Theme park manager el consejo del mdico. Asegrese de hacerle al mdico cualquier pregunta que tenga. Document Revised: 05/02/2021 Document Reviewed: 05/02/2021 Elsevier Patient Education  2023 Elsevier Inc.     Edwina Barth, MD [facility]

## 2022-08-07 ENCOUNTER — Telehealth: Payer: Self-pay | Admitting: Emergency Medicine

## 2022-08-07 DIAGNOSIS — I10 Essential (primary) hypertension: Secondary | ICD-10-CM

## 2022-08-07 NOTE — Telephone Encounter (Signed)
Caller & Relationship to patient: Self  Call back number: [phone]   Date of last office visit: 11.8.23  Date of next office visit: 5.13.24  Medication(s) to be refilled:  losartan-hydrochlorothiazide (HYZAAR) 100-12.5 MG tablet   Preferred Pharmacy:   [facility]   Phone: [phone]  Fax: [phone]

## 2022-08-08 MED ORDER — LOSARTAN POTASSIUM-HCTZ 100-12.5 MG PO TABS: 1.0000 | ORAL_TABLET | Freq: Every day | ORAL | 3 refills | Status: DC

## 2022-08-08 NOTE — Telephone Encounter (Signed)
Sent new prescription to patient pharmacy.

## 2022-11-12 ENCOUNTER — Ambulatory Visit: Payer: Self-pay | Admitting: Emergency Medicine

## 2022-11-17 ENCOUNTER — Ambulatory Visit (INDEPENDENT_AMBULATORY_CARE_PROVIDER_SITE_OTHER): Payer: Self-pay | Admitting: Emergency Medicine

## 2022-11-17 ENCOUNTER — Encounter: Payer: Self-pay | Admitting: Emergency Medicine

## 2022-11-17 VITALS — BP 136/84 | HR 75 | Temp 98.1°F | Ht 63.0 in | Wt 251.0 lb

## 2022-11-17 DIAGNOSIS — Z1211 Encounter for screening for malignant neoplasm of colon: Secondary | ICD-10-CM

## 2022-11-17 DIAGNOSIS — I10 Essential (primary) hypertension: Secondary | ICD-10-CM

## 2022-11-17 DIAGNOSIS — Z6841 Body Mass Index (BMI) 40.0 and over, adult: Secondary | ICD-10-CM

## 2022-11-17 LAB — COMPREHENSIVE METABOLIC PANEL
ALT: 28 U/L (ref 0–35)
AST: 23 U/L (ref 0–37)
Albumin: 4.1 g/dL (ref 3.5–5.2)
Alkaline Phosphatase: 64 U/L (ref 39–117)
BUN: 10 mg/dL (ref 6–23)
CO2: 25 mEq/L (ref 19–32)
Calcium: 9.1 mg/dL (ref 8.4–10.5)
Chloride: 103 mEq/L (ref 96–112)
Creatinine, Ser: 0.61 mg/dL (ref 0.40–1.20)
GFR: 107.16 mL/min (ref >60.00)
Glucose, Bld: 100 mg/dL — ABNORMAL HIGH (ref 70–99)
Potassium: 4 mEq/L (ref 3.5–5.1)
Sodium: 137 mEq/L (ref 135–145)
Total Bilirubin: 1.3 mg/dL — ABNORMAL HIGH (ref 0.2–1.2)
Total Protein: 7.2 g/dL (ref 6.0–8.3)

## 2022-11-17 LAB — CBC WITH DIFFERENTIAL/PLATELET
Basophils Absolute: 0.1 10*3/uL (ref 0.0–0.1)
Basophils Relative: 0.9 % (ref 0.0–3.0)
Eosinophils Absolute: 0.3 10*3/uL (ref 0.0–0.7)
Eosinophils Relative: 2.6 % (ref 0.0–5.0)
HCT: 37.4 % (ref 36.0–46.0)
Hemoglobin: 12.7 g/dL (ref 12.0–15.0)
Lymphocytes Relative: 19.8 % (ref 12.0–46.0)
Lymphs Abs: 2 10*3/uL (ref 0.7–4.0)
MCHC: 34 g/dL (ref 30.0–36.0)
MCV: 89.1 fl (ref 78.0–100.0)
Monocytes Absolute: 0.6 10*3/uL (ref 0.1–1.0)
Monocytes Relative: 6.4 % (ref 3.0–12.0)
Neutro Abs: 6.9 10*3/uL (ref 1.4–7.7)
Neutrophils Relative %: 70.3 % (ref 43.0–77.0)
Platelets: 289 10*3/uL (ref 150.0–400.0)
RBC: 4.2 Mil/uL (ref 3.87–5.11)
RDW: 13.8 % (ref 11.5–15.5)
WBC: 9.9 10*3/uL (ref 4.0–10.5)

## 2022-11-17 LAB — LIPID PANEL
Cholesterol: 123 mg/dL (ref 0–200)
HDL: 43.4 mg/dL (ref >39.00)
LDL Cholesterol: 67 mg/dL (ref 0–99)
NonHDL: 80.09
Total CHOL/HDL Ratio: 3
Triglycerides: 65 mg/dL (ref 0.0–149.0)
VLDL: 13 mg/dL (ref 0.0–40.0)

## 2022-11-17 LAB — HEMOGLOBIN A1C: Hgb A1c MFr Bld: 5.9 % (ref 4.6–6.5)

## 2022-11-17 NOTE — Assessment & Plan Note (Signed)
Well-controlled hypertension with normal blood pressure readings at home. Continue labetalol 100 mg twice a day and Hyzaar 100-12.5 mg once a day Cardiovascular risks associated with hypertension discussed Dietary approaches to stop hypertension discussed Follow-up in 6 months

## 2022-11-17 NOTE — Progress Notes (Signed)
Kristen Madden 47 y.o.   Chief Complaint  Patient presents with   Medical Management of Chronic Issues    f/u appt, no concerns     HISTORY OF PRESENT ILLNESS: This is a 47 y.o. female here for 32-month follow-up of hypertension and obesity. Had 1 episode of dizziness that lasted less than a minute while driving about 4 weeks ago.  Single episode.  No sequela. No other complaints or medical concerns today. BP Readings from Last 3 Encounters:  11/17/22 136/84  05/14/22 120/70  10/03/21 120/70   Wt Readings from Last 3 Encounters:  11/17/22 251 lb (113.9 kg)  05/14/22 247 lb (112 kg)  10/03/21 254 lb 4 oz (115.3 kg)     HPI   Prior to Admission medications   Medication Sig Start Date End Date Taking? Authorizing Provider  labetalol (NORMODYNE) 100 MG tablet Take 1 tablet (100 mg total) by mouth 2 (two) times daily. 05/14/22 05/09/23 Yes Twylah Bennetts, Eilleen Kempf, MD  losartan-hydrochlorothiazide [facility]) 100-12.5 MG tablet Take 1 tablet by mouth daily. 08/08/22  Yes Georgina Quint, MD    No Known Allergies  Patient Active Problem List   Diagnosis Date Noted   Body mass index (BMI) of 40.1-44.9 in adult [facility]) 06/05/2021   Obesity 07/21/2018   Adenomyosis 07/21/2018   HTN (hypertension) 06/28/2013    Past Medical History:  Diagnosis Date   Hypertension    Ovarian cyst     Past Surgical History:  Procedure Laterality Date   CHOLECYSTECTOMY N/A 05/16/2014   Procedure: LAPAROSCOPIC CHOLECYSTECTOMY WITH INTRAOPERATIVE CHOLANGIOGRAM;  Surgeon: Atilano Ina, MD;  Location: [facility] OR;  Service: General;  Laterality: N/A;   NO PAST SURGERIES      Social History   Socioeconomic History   Marital status: Married    Spouse name: Not on file   Number of children: Not on file   Years of education: Not on file   Highest education level: Not on file  Occupational History   Not on file  Tobacco Use   Smoking status: Never   Smokeless tobacco: Never  Vaping  Use   Vaping Use: Never used  Substance and Sexual Activity   Alcohol use: No   Drug use: No   Sexual activity: Yes    Birth control/protection: Condom  Other Topics Concern   Not on file  Social History Narrative   Not on file   Social Determinants of Health   Financial Resource Strain: Not on file  Food Insecurity: Not on file  Transportation Needs: Not on file  Physical Activity: Not on file  Stress: Not on file  Social Connections: Not on file  Intimate Partner Violence: Not on file    Family History  Problem Relation Age of Onset   Hypertension Mother      Review of Systems  Constitutional: Negative.  Negative for chills and fever.  HENT: Negative.  Negative for congestion and sore throat.   Respiratory: Negative.  Negative for cough and shortness of breath.   Cardiovascular: Negative.  Negative for chest pain and palpitations.  Gastrointestinal:  Negative for abdominal pain, nausea and vomiting.  Skin: Negative.  Negative for rash.  Neurological:  Positive for dizziness. Negative for headaches.  All other systems reviewed and are negative.   Vitals:   11/17/22 0919  BP: 136/84  Pulse: 75  Temp: 98.1 F (36.7 C)  SpO2: 97%    Physical Exam Vitals reviewed.  Constitutional:  Appearance: Normal appearance. She is obese.  HENT:     Head: Normocephalic.  Eyes:     Extraocular Movements: Extraocular movements intact.     Conjunctiva/sclera: Conjunctivae normal.     Pupils: Pupils are equal, round, and reactive to light.  Cardiovascular:     Rate and Rhythm: Normal rate and regular rhythm.     Pulses: Normal pulses.     Heart sounds: Normal heart sounds.  Pulmonary:     Effort: Pulmonary effort is normal.     Breath sounds: Normal breath sounds.  Abdominal:     Palpations: Abdomen is soft.     Tenderness: There is no abdominal tenderness.  Musculoskeletal:     Cervical back: No tenderness.  Lymphadenopathy:     Cervical: No cervical  adenopathy.  Skin:    General: Skin is warm and dry.     Capillary Refill: Capillary refill takes less than 2 seconds.  Neurological:     Mental Status: She is alert and oriented to person, place, and time.  Psychiatric:        Mood and Affect: Mood normal.        Behavior: Behavior normal.      ASSESSMENT & PLAN: Problem List Items Addressed This Visit       Cardiovascular and Mediastinum   HTN (hypertension) - Primary    Well-controlled hypertension with normal blood pressure readings at home. Continue labetalol 100 mg twice a day and Hyzaar 100-12.5 mg once a day Cardiovascular risks associated with hypertension discussed Dietary approaches to stop hypertension discussed Follow-up in 6 months      Relevant Orders   CBC with Differential/Platelet   Comprehensive metabolic panel   Hemoglobin A1c   Lipid panel     Other   Body mass index (BMI) of 40.1-44.9 in adult [facility])    Diet and nutrition discussed. Advised to decrease amount of daily carbohydrate intake and daily calories and increase amount of plant based protein in her diet Benefits of exercise discussed. Follow-up in 6 months.      Other Visit Diagnoses     Screening for colon cancer       Relevant Orders   Ambulatory referral to Gastroenterology      Patient Instructions  Recuento de caloras para bajar de peso Calorie Counting for Weight Loss Las caloras son unidades de Engineer, drilling. El cuerpo necesita una cierta cantidad de caloras de los alimentos para que lo ayuden a funcionar durante todo Medical laboratory scientific officer. Cuando se comen o beben ms caloras de las que el cuerpo [place], este acumula las caloras adicionales mayormente como grasa. Cuando se comen o beben menos caloras de las que el cuerpo [place], este quema grasa para obtener la energa que necesita. El recuento de caloras es el registro de la cantidad de caloras que se comen y Audiological scientist. El recuento de caloras puede ser de ayuda si necesita perder  peso. Si come menos caloras de las que el cuerpo necesita, debera bajar de [employer]. Pregntele al mdico cul es un peso sano para usted. Para que el recuento de caloras funcione, usted tendr que ingerir la cantidad de caloras adecuadas cada da, para bajar una cantidad de peso saludable por semana. Un nutricionista puede ayudar a determinar la cantidad de caloras que usted necesita por da y sugerirle formas de Barista su objetivo calrico. Neomia Dear cantidad de peso saludable para bajar cada semana suele ser entre 1 y 2 libras (0.5 a 0.9 kg). Esto habitualmente significa que  su ingesta diaria de caloras se debera reducir en unas 500 a 750 caloras. Ingerir de 1200 a 1500 caloras por Clinical research associate a la [employer] de las mujeres a Publishing copy de [place]. Ingerir de 1500 a 1800 caloras por Clinical research associate a la [employer] de los hombres a Publishing copy de [place]. Qu debo saber acerca del recuento de caloras? Trabaje con el mdico o el nutricionista para determinar cuntas caloras debe recibir Management consultant. A fin de alcanzar su objetivo diario de caloras, tendr que: Averiguar cuntas caloras hay en cada alimento que le Lobbyist. Intente hacerlo antes de comer. Decidir la cantidad que puede comer del alimento. Llevar un registro de los alimentos. Para esto, anote lo que comi y cuntas caloras tena. Para perder peso con xito, es importante equilibrar el recuento de caloras con un estilo de vida saludable que incluya actividad fsica de forma regular. Dnde encuentro informacin sobre las caloras?  Es posible Veterinary surgeon cantidad de caloras que contiene un alimento en la etiqueta de informacin nutricional. Si un alimento no tiene una etiqueta de informacin nutricional, intente buscar las caloras en Internet o pida ayuda al nutricionista. Recuerde que las caloras se calculan por porcin. Si opta por comer ms de una porcin de un alimento, tendr [employer] las caloras de una porcin por la cantidad  de porciones que planea comer. Por ejemplo, la etiqueta de un envase de pan puede decir que el tamao de una porcin es 1 rodaja, y que una porcin tiene 90 caloras. Si come 1 rodaja, habr comido 90 caloras. Si come 2 rodajas, habr comido 180 caloras. Cmo llevo un registro de comidas? Despus de cada vez que coma, anote lo siguiente en el registro de alimentos lo antes posible: Lo que comi. Asegrese de [employer], las salsas y otros extras [employer]. La cantidad que comi. Esto se puede medir en tazas, onzas o cantidad de alimentos. Cuntas caloras haba en cada alimento y en cada bebida. La cantidad total de caloras en la comida que tom. Tenga a Scientific laboratory technician de alimentos, por ejemplo, en un anotador de bolsillo o utilice una aplicacin o sitio web en el telfono mvil. Algunos programas calcularn las caloras por usted y Insurance account manager la cantidad de caloras que le quedan para llegar al objetivo diario. Cules son algunos consejos para controlar las porciones? Sepa cuntas caloras hay en una porcin. Esto lo ayudar a saber cuntas porciones de un alimento determinado puede comer. Use una taza medidora para medir los tamaos de las porciones. Tambin Hydrographic surveyor las porciones en una balanza de cocina. Con el tiempo, podr hacer un clculo estimativo de los tamaos de las porciones de algunos alimentos. Dedique tiempo a poner porciones de diferentes alimentos en sus platos, tazones y tazas predilectos, a fin de saber cmo se ve una porcin. Intente no comer directamente de un envase de alimentos, por ejemplo, de una bolsa o una caja. Comer directamente del envase dificulta ver cunto est comiendo y puede conducir a Actuary. Ponga la cantidad [employer] gustara comer en una taza o un plato, a fin de asegurarse de que est comiendo la porcin correcta. Use platos, vasos y tazones ms pequeos para medir porciones ms pequeas y Automotive engineer no comer en  exceso. Intente no realizar varias tareas al [employer]. Por ejemplo, evite mirar televisin o usar la [employer] come. Si es la hora de comer, sintese a Museum/gallery conservator y disfrute de Chemical engineer. Esto  lo ayudar a reconocer cundo est satisfecho. Tambin le permitir estar ms consciente de qu come y cunto come. Consejos para seguir Surveyor, minerals Al leer las etiquetas de los alimentos Controle el recuento de caloras en comparacin con el tamao de la porcin. El tamao de la porcin puede ser ms pequeo de lo que suele comer. Verifique la fuente de las caloras. Intente elegir alimentos ricos en protenas, fibras y vitaminas, y bajos en grasas saturadas, grasas trans y [place]. Al ir de compras Lea las etiquetas nutricionales cuando compre. Esto lo ayudar a tomar decisiones saludables sobre qu alimentos comprar. Preste atencin a las etiquetas nutricionales de alimentos bajos en grasas o sin grasas. Estos alimentos a veces tienen la misma cantidad de caloras o ms caloras que las versiones ricas en grasas. Con frecuencia, tambin tienen agregados de azcar, almidn o sal, para darles el sabor que fue eliminado con las grasas. Haga una lista de compras con los alimentos que tienen un menor contenido de caloras y Leisure centre manager. Al cocinar Intente cocinar sus alimentos preferidos de una manera ms saludable. Por ejemplo, pruebe hornear en vez de frer. Utilice productos lcteos descremados. Planificacin de las comidas Utilice ms frutas y verduras. La mitad de su plato debe ser de frutas y verduras. Incluya protenas magras, como pollo, pavo y [place]. Estilo de [employer], trate de hacer una de las siguientes cosas: 150 minutos de ejercicio moderado, como caminar. 75 minutos de ejercicio enrgico, como correr. Informacin general Sepa cuntas caloras tienen los alimentos que come con ms frecuencia. Esto le ayudar a contar las caloras ms rpidamente. Encuentre un mtodo para  controlar las caloras que funcione para usted. Sea creativo. Pruebe aplicaciones o programas distintos, si llevar un registro de las caloras no funciona para usted. Qu alimentos debo consumir?  Consuma alimentos nutritivos. Es mejor comer un alimento nutritivo, de alto contenido calrico, como un aguacate, que uno con pocos nutrientes, como una bolsa de patatas fritas. Use sus caloras en alimentos y bebidas que lo sacien y no lo dejen con apetito apenas termina de comer. Ejemplos de alimentos que lo sacian son los frutos secos y Civil engineer, contracting de frutos secos, verduras, Associate Professor y Forensic scientist con alto contenido de Research scientist (life sciences) como los cereales integrales. Los alimentos con alto contenido de [place] son aquellos que tienen ms de 5 g de fibra por porcin. Preste atencin a las [employer]ands. Las bebidas de bajas caloras incluyen agua y refrescos sin International aid/development worker. Es posible que los productos que se enumeran ms [place] no constituyan una lista completa de los alimentos y las bebidas que puede tomar. Consulte a un nutricionista para obtener ms informacin. Qu alimentos debo limitar? Limite el consumo de alimentos o bebidas que no sean buenas fuentes de vitaminas, minerales o protenas, o que tengan alto contenido de grasas no saludables. Estos incluyen: Caramelos. Otros dulces. Refrescos, bebidas con caf especiales, alcohol y [place]. Es posible que los productos que se enumeran ms [place] no constituyan una lista completa de los alimentos y las bebidas que Personnel officer. Consulte a un nutricionista para obtener ms informacin. Cmo puedo hacer el recuento de caloras cuando como afuera? Preste atencin a las porciones. A menudo, las porciones son mucho ms grandes al comer afuera. Pruebe con estos consejos para mantener las porciones ms pequeas: Considere la posibilidad de compartir una comida en lugar de tomarla toda usted solo. Si pide su propia comida, coma solo la mitad. Antes de empezar  a comer, pida un recipiente y [place]  la mitad de la comida en l. Cuando sea posible, considere la posibilidad de pedir porciones ms pequeas del men en lugar de porciones completas. Preste atencin a Soil scientist de alimentos y bebidas. Saber la forma en que se cocinan los alimentos y lo que incluye la comida puede ayudarlo a ingerir menos caloras. Si se detallan las caloras en el men, elija las opciones que contengan la menor cantidad. Elija platos que incluyan verduras, frutas, cereales integrales, productos lcteos con bajo contenido de grasa y Associate Professor. Opte por los alimentos hervidos, asados, cocidos a la parrilla o al vapor. Evite los alimentos a los que se les ponga mantequilla, que estn empanados o fritos, o que se sirvan con salsa a base de crema. Generalmente, los alimentos que se etiquetan como "crujientes" estn fritos, a menos que se indique lo contrario. Elija el agua, la [place], [place] t helado sin azcar u otras bebidas que no contengan azcares agregados. Si desea una bebida alcohlica, escoja una opcin con menos caloras, como una copa de vino o una cerveza ligera. Ordene los [employer], las salsas y los jarabes aparte. Estos son, con frecuencia, de alto contenido en caloras, por lo que debe limitar la cantidad que ingiere. Si desea [place], elija una de hortalizas y pida carnes a la parrilla. Evite las guarniciones adicionales como el tocino, el queso o los alimentos fritos. Ordene el aderezo aparte o pida aceite de [place] y vinagre o limn para Emergency planning/management officer. Haga un clculo estimativo de la cantidad de porciones que le sirven. Conocer el tamao de las porciones lo ayudar a Theme park manager atento a la cantidad de comida que come [employer]. Dnde buscar ms informacin Centers for Disease Control and Prevention (Centros para el Control y la Prevencin de Enfermedades): FootballExhibition.com.br U.S. Department of Agriculture (Departamento de Agricultura de los EE. UU.):  WrestlingReporter.dk Resumen El recuento de caloras es el registro de la cantidad de caloras que se comen y Audiological scientist. Si come menos caloras de las que el cuerpo necesita, debera bajar de [place]. Una cantidad de peso saludable para bajar por semana suele ser entre 1 y 2 libras (0.5 a 0.9 kg). Esto significa, con frecuencia, reducir su ingesta diaria de caloras unas 500 a 750 caloras. Es posible Veterinary surgeon cantidad de caloras que contiene un alimento en la etiqueta de informacin nutricional. Si un alimento no tiene una etiqueta de informacin nutricional, intente buscar las caloras en Internet o pida ayuda al nutricionista. Use platos, vasos y tazones ms pequeos para medir porciones ms pequeas y Automotive engineer no comer en exceso. Use sus caloras en alimentos y bebidas que lo sacien y no lo dejen con apetito poco tiempo despus de haber comido. Esta informacin no tiene Theme park manager el consejo del mdico. Asegrese de hacerle al mdico cualquier pregunta que tenga. Document Revised: 10/17/2019 Document Reviewed: 10/17/2019 Elsevier Patient Education  2023 Elsevier Inc.     Edwina Barth, MD [facility]

## 2022-11-17 NOTE — Patient Instructions (Signed)
Recuento de caloras para bajar de peso Calorie Counting for Weight Loss Las caloras son unidades de energa. El cuerpo necesita una cierta cantidad de caloras de los alimentos para que lo ayuden a funcionar durante todo el da. Cuando se comen o beben ms caloras de las que el cuerpo necesita, este acumula las caloras adicionales mayormente como grasa. Cuando se comen o beben menos caloras de las que el cuerpo necesita, este quema grasa para obtener la energa que necesita. El recuento de caloras es el registro de la cantidad de caloras que se comen y beben cada da. El recuento de caloras puede ser de ayuda si necesita perder peso. Si come menos caloras de las que el cuerpo necesita, debera bajar de peso. Pregntele al mdico cul es un peso sano para usted. Para que el recuento de caloras funcione, usted tendr que ingerir la cantidad de caloras adecuadas cada da, para bajar una cantidad de peso saludable por semana. Un nutricionista puede ayudar a determinar la cantidad de caloras que usted necesita por da y sugerirle formas de alcanzar su objetivo calrico. Una cantidad de peso saludable para bajar cada semana suele ser entre 1 y 2 libras (0.5 a 0.9 kg). Esto habitualmente significa que su ingesta diaria de caloras se debera reducir en unas 500 a 750 caloras. Ingerir de 1200 a 1500 caloras por da puede ayudar a la mayora de las mujeres a bajar de peso. Ingerir de 1500 a 1800 caloras por da puede ayudar a la mayora de los hombres a bajar de peso. Qu debo saber acerca del recuento de caloras? Trabaje con el mdico o el nutricionista para determinar cuntas caloras debe recibir cada da. A fin de alcanzar su objetivo diario de caloras, tendr que: Averiguar cuntas caloras hay en cada alimento que le gustara comer. Intente hacerlo antes de comer. Decidir la cantidad que puede comer del alimento. Llevar un registro de los alimentos. Para esto, anote lo que comi y cuntas  caloras tena. Para perder peso con xito, es importante equilibrar el recuento de caloras con un estilo de vida saludable que incluya actividad fsica de forma regular. Dnde encuentro informacin sobre las caloras?  Es posible encontrar la cantidad de caloras que contiene un alimento en la etiqueta de informacin nutricional. Si un alimento no tiene una etiqueta de informacin nutricional, intente buscar las caloras en Internet o pida ayuda al nutricionista. Recuerde que las caloras se calculan por porcin. Si opta por comer ms de una porcin de un alimento, tendr que multiplicar las caloras de una porcin por la cantidad de porciones que planea comer. Por ejemplo, la etiqueta de un envase de pan puede decir que el tamao de una porcin es 1 rodaja, y que una porcin tiene 90 caloras. Si come 1 rodaja, habr comido 90 caloras. Si come 2 rodajas, habr comido 180 caloras. Cmo llevo un registro de comidas? Despus de cada vez que coma, anote lo siguiente en el registro de alimentos lo antes posible: Lo que comi. Asegrese de incluir los aderezos, las salsas y otros extras en los alimentos. La cantidad que comi. Esto se puede medir en tazas, onzas o cantidad de alimentos. Cuntas caloras haba en cada alimento y en cada bebida. La cantidad total de caloras en la comida que tom. Tenga a mano el registro de alimentos, por ejemplo, en un anotador de bolsillo o utilice una aplicacin o sitio web en el telfono mvil. Algunos programas calcularn las caloras por usted y le mostrarn la cantidad de   caloras que le quedan para llegar al objetivo diario. Cules son algunos consejos para controlar las porciones? Sepa cuntas caloras hay en una porcin. Esto lo ayudar a saber cuntas porciones de un alimento determinado puede comer. Use una taza medidora para medir los tamaos de las porciones. Tambin puede intentar pesar las porciones en una balanza de cocina. Con el tiempo, podr hacer  un clculo estimativo de los tamaos de las porciones de algunos alimentos. Dedique tiempo a poner porciones de diferentes alimentos en sus platos, tazones y tazas predilectos, a fin de saber cmo se ve una porcin. Intente no comer directamente de un envase de alimentos, por ejemplo, de una bolsa o una caja. Comer directamente del envase dificulta ver cunto est comiendo y puede conducir a comer en exceso. Ponga la cantidad que le gustara comer en una taza o un plato, a fin de asegurarse de que est comiendo la porcin correcta. Use platos, vasos y tazones ms pequeos para medir porciones ms pequeas y evitar no comer en exceso. Intente no realizar varias tareas al mismo tiempo. Por ejemplo, evite mirar televisin o usar la computadora mientras come. Si es la hora de comer, sintese a la mesa y disfrute de la comida. Esto lo ayudar a reconocer cundo est satisfecho. Tambin le permitir estar ms consciente de qu come y cunto come. Consejos para seguir este plan Al leer las etiquetas de los alimentos Controle el recuento de caloras en comparacin con el tamao de la porcin. El tamao de la porcin puede ser ms pequeo de lo que suele comer. Verifique la fuente de las caloras. Intente elegir alimentos ricos en protenas, fibras y vitaminas, y bajos en grasas saturadas, grasas trans y sodio. Al ir de compras Lea las etiquetas nutricionales cuando compre. Esto lo ayudar a tomar decisiones saludables sobre qu alimentos comprar. Preste atencin a las etiquetas nutricionales de alimentos bajos en grasas o sin grasas. Estos alimentos a veces tienen la misma cantidad de caloras o ms caloras que las versiones ricas en grasas. Con frecuencia, tambin tienen agregados de azcar, almidn o sal, para darles el sabor que fue eliminado con las grasas. Haga una lista de compras con los alimentos que tienen un menor contenido de caloras y resptela. Al cocinar Intente cocinar sus alimentos preferidos  de una manera ms saludable. Por ejemplo, pruebe hornear en vez de frer. Utilice productos lcteos descremados. Planificacin de las comidas Utilice ms frutas y verduras. La mitad de su plato debe ser de frutas y verduras. Incluya protenas magras, como pollo, pavo y pescado. Estilo de vida Cada semana, trate de hacer una de las siguientes cosas: 150 minutos de ejercicio moderado, como caminar. 75 minutos de ejercicio enrgico, como correr. Informacin general Sepa cuntas caloras tienen los alimentos que come con ms frecuencia. Esto le ayudar a contar las caloras ms rpidamente. Encuentre un mtodo para controlar las caloras que funcione para usted. Sea creativo. Pruebe aplicaciones o programas distintos, si llevar un registro de las caloras no funciona para usted. Qu alimentos debo consumir?  Consuma alimentos nutritivos. Es mejor comer un alimento nutritivo, de alto contenido calrico, como un aguacate, que uno con pocos nutrientes, como una bolsa de patatas fritas. Use sus caloras en alimentos y bebidas que lo sacien y no lo dejen con apetito apenas termina de comer. Ejemplos de alimentos que lo sacian son los frutos secos y mantequillas de frutos secos, verduras, protenas magras y alimentos con alto contenido de fibra como los cereales integrales. Los alimentos con alto   contenido de fibra son aquellos que tienen ms de 5 g de fibra por porcin. Preste atencin a las caloras en las bebidas. Las bebidas de bajas caloras incluyen agua y refrescos sin azcar. Es posible que los productos que se enumeran ms arriba no constituyan una lista completa de los alimentos y las bebidas que puede tomar. Consulte a un nutricionista para obtener ms informacin. Qu alimentos debo limitar? Limite el consumo de alimentos o bebidas que no sean buenas fuentes de vitaminas, minerales o protenas, o que tengan alto contenido de grasas no saludables. Estos incluyen: Caramelos. Otros  dulces. Refrescos, bebidas con caf especiales, alcohol y jugo. Es posible que los productos que se enumeran ms arriba no constituyan una lista completa de los alimentos y las bebidas que debe evitar. Consulte a un nutricionista para obtener ms informacin. Cmo puedo hacer el recuento de caloras cuando como afuera? Preste atencin a las porciones. A menudo, las porciones son mucho ms grandes al comer afuera. Pruebe con estos consejos para mantener las porciones ms pequeas: Considere la posibilidad de compartir una comida en lugar de tomarla toda usted solo. Si pide su propia comida, coma solo la mitad. Antes de empezar a comer, pida un recipiente y ponga la mitad de la comida en l. Cuando sea posible, considere la posibilidad de pedir porciones ms pequeas del men en lugar de porciones completas. Preste atencin a la eleccin de alimentos y bebidas. Saber la forma en que se cocinan los alimentos y lo que incluye la comida puede ayudarlo a ingerir menos caloras. Si se detallan las caloras en el men, elija las opciones que contengan la menor cantidad. Elija platos que incluyan verduras, frutas, cereales integrales, productos lcteos con bajo contenido de grasa y protenas magras. Opte por los alimentos hervidos, asados, cocidos a la parrilla o al vapor. Evite los alimentos a los que se les ponga mantequilla, que estn empanados o fritos, o que se sirvan con salsa a base de crema. Generalmente, los alimentos que se etiquetan como "crujientes" estn fritos, a menos que se indique lo contrario. Elija el agua, la leche descremada, el t helado sin azcar u otras bebidas que no contengan azcares agregados. Si desea una bebida alcohlica, escoja una opcin con menos caloras, como una copa de vino o una cerveza ligera. Ordene los aderezos, las salsas y los jarabes aparte. Estos son, con frecuencia, de alto contenido en caloras, por lo que debe limitar la cantidad que ingiere. Si desea una  ensalada, elija una de hortalizas y pida carnes a la parrilla. Evite las guarniciones adicionales como el tocino, el queso o los alimentos fritos. Ordene el aderezo aparte o pida aceite de oliva y vinagre o limn para aderezar. Haga un clculo estimativo de la cantidad de porciones que le sirven. Conocer el tamao de las porciones lo ayudar a estar atento a la cantidad de comida que come en los restaurantes. Dnde buscar ms informacin Centers for Disease Control and Prevention (Centros para el Control y la Prevencin de Enfermedades): www.cdc.gov U.S. Department of Agriculture (Departamento de Agricultura de los EE. UU.): myplate.gov Resumen El recuento de caloras es el registro de la cantidad de caloras que se comen y beben cada da. Si come menos caloras de las que el cuerpo necesita, debera bajar de peso. Una cantidad de peso saludable para bajar por semana suele ser entre 1 y 2 libras (0.5 a 0.9 kg). Esto significa, con frecuencia, reducir su ingesta diaria de caloras unas 500 a 750 caloras. Es posible   encontrar la cantidad de caloras que contiene un alimento en la etiqueta de informacin nutricional. Si un alimento no tiene una etiqueta de informacin nutricional, intente buscar las caloras en Internet o pida ayuda al nutricionista. Use platos, vasos y tazones ms pequeos para medir porciones ms pequeas y evitar no comer en exceso. Use sus caloras en alimentos y bebidas que lo sacien y no lo dejen con apetito poco tiempo despus de haber comido. Esta informacin no tiene como fin reemplazar el consejo del mdico. Asegrese de hacerle al mdico cualquier pregunta que tenga. Document Revised: 10/17/2019 Document Reviewed: 10/17/2019 Elsevier Patient Education  2023 Elsevier Inc.  

## 2022-11-17 NOTE — Assessment & Plan Note (Signed)
Diet and nutrition discussed. Advised to decrease amount of daily carbohydrate intake and daily calories and increase amount of plant based protein in her diet Benefits of exercise discussed. Follow-up in 6 months.

## 2023-04-25 ENCOUNTER — Other Ambulatory Visit: Payer: Self-pay | Admitting: Emergency Medicine

## 2023-04-25 DIAGNOSIS — I10 Essential (primary) hypertension: Secondary | ICD-10-CM

## 2023-05-18 ENCOUNTER — Encounter: Payer: Self-pay | Admitting: Emergency Medicine

## 2023-05-18 ENCOUNTER — Ambulatory Visit (INDEPENDENT_AMBULATORY_CARE_PROVIDER_SITE_OTHER): Payer: Self-pay | Admitting: Emergency Medicine

## 2023-05-18 VITALS — BP 116/70 | HR 65 | Temp 98.6°F | Ht 63.0 in | Wt 242.8 lb

## 2023-05-18 DIAGNOSIS — I10 Essential (primary) hypertension: Secondary | ICD-10-CM

## 2023-05-18 DIAGNOSIS — Z6841 Body Mass Index (BMI) 40.0 and over, adult: Secondary | ICD-10-CM

## 2023-05-18 LAB — CBC WITH DIFFERENTIAL/PLATELET
Basophils Absolute: 0.1 10*3/uL (ref 0.0–0.1)
Basophils Relative: 1.1 % (ref 0.0–3.0)
Eosinophils Absolute: 0.2 10*3/uL (ref 0.0–0.7)
Eosinophils Relative: 1.9 % (ref 0.0–5.0)
HCT: 38.4 % (ref 36.0–46.0)
Hemoglobin: 13.1 g/dL (ref 12.0–15.0)
Lymphocytes Relative: 16.6 % (ref 12.0–46.0)
Lymphs Abs: 1.4 10*3/uL (ref 0.7–4.0)
MCHC: 34 g/dL (ref 30.0–36.0)
MCV: 89.8 fL (ref 78.0–100.0)
Monocytes Absolute: 0.5 10*3/uL (ref 0.1–1.0)
Monocytes Relative: 6.1 % (ref 3.0–12.0)
Neutro Abs: 6.4 10*3/uL (ref 1.4–7.7)
Neutrophils Relative %: 74.3 % (ref 43.0–77.0)
Platelets: 286 10*3/uL (ref 150.0–400.0)
RBC: 4.28 Mil/uL (ref 3.87–5.11)
RDW: 13.5 % (ref 11.5–15.5)
WBC: 8.6 10*3/uL (ref 4.0–10.5)

## 2023-05-18 LAB — COMPREHENSIVE METABOLIC PANEL
ALT: 26 U/L (ref 0–35)
AST: 25 U/L (ref 0–37)
Albumin: 4.3 g/dL (ref 3.5–5.2)
Alkaline Phosphatase: 62 U/L (ref 39–117)
BUN: 9 mg/dL (ref 6–23)
CO2: 26 meq/L (ref 19–32)
Calcium: 9.3 mg/dL (ref 8.4–10.5)
Chloride: 103 meq/L (ref 96–112)
Creatinine, Ser: 0.6 mg/dL (ref 0.40–1.20)
GFR: 107.22 mL/min (ref >60.00)
Glucose, Bld: 114 mg/dL — ABNORMAL HIGH (ref 70–99)
Potassium: 4 meq/L (ref 3.5–5.1)
Sodium: 137 meq/L (ref 135–145)
Total Bilirubin: 1.3 mg/dL — ABNORMAL HIGH (ref 0.2–1.2)
Total Protein: 7.2 g/dL (ref 6.0–8.3)

## 2023-05-18 LAB — LIPID PANEL
Cholesterol: 140 mg/dL (ref 0–200)
HDL: 45.7 mg/dL (ref >39.00)
LDL Cholesterol: 78 mg/dL (ref 0–99)
NonHDL: 94.33
Total CHOL/HDL Ratio: 3
Triglycerides: 80 mg/dL (ref 0.0–149.0)
VLDL: 16 mg/dL (ref 0.0–40.0)

## 2023-05-18 LAB — HEMOGLOBIN A1C: Hgb A1c MFr Bld: 5.9 % (ref 4.6–6.5)

## 2023-05-18 NOTE — Assessment & Plan Note (Signed)
Well-controlled hypertension with normal blood pressure readings at home Cardiovascular risks associated with hypertension discussed Benefit of exercise discussed Continue labetalol 100 mg twice a day and Hyzaar 100-12.5 mg daily

## 2023-05-18 NOTE — Progress Notes (Signed)
Kristen Madden 47 y.o.   Chief Complaint  Patient presents with   Follow-up    75month f/u for HTN    HISTORY OF PRESENT ILLNESS: This is a 47 y.o. female here for 41-month follow-up of hypertension Overall doing well.  Has no complaints or medical concerns today. BP Readings from Last 3 Encounters:  05/18/23 (!) 132/90  11/17/22 136/84  05/14/22 120/70   Wt Readings from Last 3 Encounters:  05/18/23 242 lb 12.8 oz (110.1 kg)  11/17/22 251 lb (113.9 kg)  05/14/22 247 lb (112 kg)     HPI   Prior to Admission medications   Medication Sig Start Date End Date Taking? Authorizing Provider  labetalol (NORMODYNE) 100 MG tablet Take 1 tablet by mouth twice daily 04/26/23  Yes Janielle Mittelstadt, Eilleen Kempf, MD  losartan-hydrochlorothiazide (HYZAAR) 100-12.5 MG tablet Take 1 tablet by mouth daily. 08/08/22  Yes Georgina Quint, MD    No Known Allergies  Patient Active Problem List   Diagnosis Date Noted   Body mass index (BMI) of 40.1-44.9 in adult [facility]) 06/05/2021   Morbid obesity (HCC) 07/21/2018   Adenomyosis 07/21/2018   HTN (hypertension) 06/28/2013    Past Medical History:  Diagnosis Date   Hypertension    Ovarian cyst     Past Surgical History:  Procedure Laterality Date   CHOLECYSTECTOMY N/A 05/16/2014   Procedure: LAPAROSCOPIC CHOLECYSTECTOMY WITH INTRAOPERATIVE CHOLANGIOGRAM;  Surgeon: Atilano Ina, MD;  Location: [facility];  Service: General;  Laterality: N/A;   NO PAST SURGERIES      Social History   Socioeconomic History   Marital status: Married    Spouse name: Not on file   Number of children: Not on file   Years of education: Not on file   Highest education level: Not on file  Occupational History   Not on file  Tobacco Use   Smoking status: Never   Smokeless tobacco: Never  Vaping Use   Vaping status: Never Used  Substance and Sexual Activity   Alcohol use: No   Drug use: No   Sexual activity: Yes    Birth control/protection: Condom   Other Topics Concern   Not on file  Social History Narrative   Not on file   Social Determinants of Health   Financial Resource Strain: Not on file  Food Insecurity: Not on file  Transportation Needs: Not on file  Physical Activity: Not on file  Stress: Not on file  Social Connections: Not on file  Intimate Partner Violence: Not on file    Family History  Problem Relation Age of Onset   Hypertension Mother      Review of Systems  Constitutional: Negative.  Negative for chills and fever.  HENT: Negative.  Negative for congestion and sore throat.   Respiratory: Negative.  Negative for cough and shortness of breath.   Cardiovascular: Negative.  Negative for chest pain and palpitations.  Gastrointestinal:  Negative for abdominal pain, diarrhea, nausea and vomiting.  Genitourinary: Negative.  Negative for dysuria and hematuria.  Skin: Negative.  Negative for rash.  Neurological: Negative.  Negative for dizziness and headaches.  All other systems reviewed and are negative.   Vitals:   05/18/23 0900  BP: (!) 132/90  Pulse: 65  Temp: 98.6 F (37 C)  SpO2: 100%    Physical Exam Vitals reviewed.  Constitutional:      Appearance: Normal appearance.  HENT:     Head: Normocephalic.  Eyes:     Extraocular  Movements: Extraocular movements intact.     Pupils: Pupils are equal, round, and reactive to light.  Cardiovascular:     Rate and Rhythm: Normal rate and regular rhythm.     Pulses: Normal pulses.     Heart sounds: Normal heart sounds.  Pulmonary:     Effort: Pulmonary effort is normal.     Breath sounds: Normal breath sounds.  Abdominal:     Palpations: Abdomen is soft.     Tenderness: There is no abdominal tenderness.  Musculoskeletal:     Cervical back: No tenderness.  Lymphadenopathy:     Cervical: No cervical adenopathy.  Skin:    General: Skin is warm and dry.     Capillary Refill: Capillary refill takes less than 2 seconds.  Neurological:      General: No focal deficit present.     Mental Status: She is alert and oriented to person, place, and time.  Psychiatric:        Mood and Affect: Mood normal.        Behavior: Behavior normal.      ASSESSMENT & PLAN: Problem List Items Addressed This Visit       Cardiovascular and Mediastinum   HTN (hypertension) - Primary    Well-controlled hypertension with normal blood pressure readings at home Cardiovascular risks associated with hypertension discussed Benefit of exercise discussed Continue labetalol 100 mg twice a day and Hyzaar 100-12.5 mg daily      Relevant Orders   CBC with Differential/Platelet   Comprehensive metabolic panel   Hemoglobin A1c   Lipid panel     Other   Morbid obesity (HCC)    Eating better and losing weight Advised to decrease amount of daily carbohydrate intake and daily calories and increase amount of plant-based protein in her diet Benefits of exercise discussed      Relevant Orders   CBC with Differential/Platelet   Comprehensive metabolic panel   Hemoglobin A1c   Lipid panel   Patient Instructions  Mantenimiento de la salud en las mujeres Health Maintenance, Female Adoptar un estilo de vida saludable y recibir atencin preventiva son importantes para promover la salud y Counsellor. Consulte al mdico sobre: El esquema adecuado para hacerse pruebas y exmenes peridicos. Cosas que puede hacer por su cuenta para prevenir enfermedades y [place] sano. Qu debo saber sobre la dieta, el peso y el ejercicio? Consuma una dieta saludable  Consuma una dieta que incluya muchas verduras, frutas, productos lcteos con bajo contenido de [place] y Associate Professor. No consuma muchos alimentos ricos en grasas slidas, azcares agregados o sodio. Mantenga un peso saludable El ndice de masa muscular [facility]) se [place] para identificar problemas de [place]. Proporciona una estimacin de la grasa corporal basndose en el peso y la altura. Su mdico puede  ayudarle a Engineer, site [facility] y a Personnel officer o Pharmacologist un peso saludable. Haga ejercicio con regularidad Haga ejercicio con regularidad. Esta es una de las prcticas ms importantes que puede hacer por su salud. La [employer] de los adultos deben seguir estas pautas: Education officer, environmental, al menos, 150 minutos de actividad fsica por semana. El ejercicio debe aumentar la frecuencia cardaca y Media planner transpirar (ejercicio de intensidad moderada). Hacer ejercicios de fortalecimiento por lo [employer] por semana. Agregue esto a su plan de ejercicio de intensidad moderada. Pase menos tiempo sentada. Incluso la actividad fsica ligera puede ser beneficiosa. Controle sus niveles de colesterol y lpidos en la sangre Comience a realizarse anlisis de lpidos y colesterol en la  sangre a los 20 aos y luego reptalos cada 5 aos. Hgase controlar los niveles de colesterol con mayor frecuencia si: Sus niveles de lpidos y colesterol son altos. Es mayor de 40 aos. Presenta un alto riesgo de padecer enfermedades cardacas. Qu debo saber sobre las pruebas de deteccin del cncer? Segn su historia clnica y sus antecedentes familiares, es posible que deba realizarse pruebas de deteccin del cncer en diferentes edades. Esto puede incluir pruebas de deteccin de lo siguiente: Cncer de mama. Cncer de cuello uterino. Cncer colorrectal. Cncer de piel. Cncer de pulmn. Qu debo saber sobre la enfermedad cardaca, la diabetes y la hipertensin arterial? Presin arterial y enfermedad cardaca La hipertensin arterial causa enfermedades cardacas y [place] el riesgo de accidente cerebrovascular. Es ms probable que esto se manifieste en las personas que tienen lecturas de presin arterial alta o tienen sobrepeso. Hgase controlar la presin arterial: Cada 3 a 5 aos si tiene entre 18 y 11 aos. Todos los aos si es mayor de 40 aos. Diabetes Realcese exmenes de deteccin de la diabetes con regularidad. Este  anlisis revisa el nivel de azcar en la sangre en [place]. Hgase las pruebas de deteccin: Cada tres aos despus de los 40 aos de edad si tiene un peso normal y un bajo riesgo de padecer diabetes. Con ms frecuencia y a partir de [place] edad inferior si tiene sobrepeso o un alto riesgo de padecer diabetes. Qu debo saber sobre la prevencin de infecciones? Hepatitis B Si tiene un riesgo ms alto de contraer hepatitis B, debe someterse a un examen de deteccin de este virus. Hable con el mdico para averiguar si tiene riesgo de contraer la infeccin por hepatitis B. Hepatitis C Se recomienda el anlisis a: [employer] 1945 y 1965. Todas las personas que tengan un riesgo de haber contrado hepatitis C. Enfermedades de transmisin sexual (ETS) Hgase las pruebas de Airline pilot de ITS, incluidas la gonorrea y la clamidia, si: Es sexualmente activa y es menor de [address]. Es mayor de [address], y Public affairs consultant informa que corre riesgo de tener este tipo de infecciones. La actividad sexual ha cambiado desde que le hicieron la ltima prueba de deteccin y tiene un riesgo mayor de Warehouse manager clamidia o Copy. Pregntele al mdico si usted tiene riesgo. Pregntele al mdico si usted tiene un alto riesgo de Primary school teacher VIH. El mdico tambin puede recomendarle un medicamento recetado para ayudar a evitar la infeccin por el VIH. Si elige tomar medicamentos para prevenir el VIH, primero debe [employer] de deteccin del VIH. Luego debe hacerse anlisis cada 3 meses mientras est tomando los medicamentos. Embarazo Si est por dejar de Armed forces training and education officer (fase premenopusica) y usted puede quedar [place], busque asesoramiento antes de [place]. Tome de 400 a 800 microgramos (mcg) de cido [employer] si [place]. Pida mtodos de control de la natalidad (anticonceptivos) si desea evitar un embarazo no deseado. Osteoporosis y [place] La osteoporosis es una enfermedad en  la que los huesos pierden los minerales y la fuerza por el avance de la edad. El resultado pueden ser fracturas en los [place]. Si tiene 65 aos o ms, o si est en riesgo de sufrir osteoporosis y fracturas, pregunte a su mdico si debe: Hacerse pruebas de deteccin de prdida sea. Tomar un suplemento de calcio o de vitamina D para reducir el riesgo de fracturas. Recibir terapia de reemplazo hormonal (TRH) para tratar los sntomas de la menopausia. Siga estas indicaciones en su  casa: Consumo de alcohol No beba alcohol si: Su mdico le indica no hacerlo. Est embarazada, puede estar embarazada o est tratando de [place]. Si bebe alcohol: Limite la cantidad que bebe a lo siguiente: De 0 a 1 bebida por da. Sepa cunta cantidad de alcohol hay en las bebidas que toma. En los [address], una medida equivale a una botella de cerveza de 12 oz (355 ml), un vaso de vino de 5 oz (148 ml) o un vaso de una bebida alcohlica de alta graduacin de 1 oz (44 ml). Estilo de vida No consuma ningn producto que contenga nicotina o tabaco. Estos productos incluyen cigarrillos, tabaco para Theatre manager y aparatos de vapeo, como los Administrator, Civil Service. Si necesita ayuda para dejar de consumir estos productos, consulte al mdico. No consuma drogas. No comparta agujas. Solicite ayuda a su mdico si necesita apoyo o informacin para abandonar las drogas. Indicaciones generales Realcese los estudios de rutina de [address], dentales y de Wellsite geologist. Mantngase al da con las vacunas. Infrmele a su mdico si: Se siente deprimida con frecuencia. Alguna vez ha sido vctima de [place] o no se siente seguro en su casa. Resumen Adoptar un estilo de vida saludable y recibir atencin preventiva son importantes para promover la salud y Counsellor. Siga las instrucciones del mdico acerca de una dieta saludable, el ejercicio y la realizacin de pruebas o exmenes para Hotel manager. Siga las  instrucciones del mdico con respecto al control del colesterol y la presin arterial. Esta informacin no tiene Theme park manager el consejo del mdico. Asegrese de hacerle al mdico cualquier pregunta que tenga. Document Revised: 11/29/2020 Document Reviewed: 11/29/2020 Elsevier Patient Education  2024 Elsevier Inc.      Edwina Barth, MD [facility]

## 2023-05-18 NOTE — Patient Instructions (Signed)

## 2023-05-18 NOTE — Assessment & Plan Note (Signed)
Eating better and losing weight Advised to decrease amount of daily carbohydrate intake and daily calories and increase amount of plant-based protein in her diet Benefits of exercise discussed

## 2023-07-22 ENCOUNTER — Other Ambulatory Visit: Payer: Self-pay | Admitting: Emergency Medicine

## 2023-07-22 DIAGNOSIS — I10 Essential (primary) hypertension: Secondary | ICD-10-CM

## 2023-08-11 ENCOUNTER — Other Ambulatory Visit: Payer: Self-pay | Admitting: Emergency Medicine

## 2023-08-11 DIAGNOSIS — I10 Essential (primary) hypertension: Secondary | ICD-10-CM

## 2023-11-02 ENCOUNTER — Other Ambulatory Visit: Payer: Self-pay | Admitting: Emergency Medicine

## 2023-11-02 DIAGNOSIS — I10 Essential (primary) hypertension: Secondary | ICD-10-CM

## 2023-11-06 ENCOUNTER — Other Ambulatory Visit: Payer: Self-pay | Admitting: Emergency Medicine

## 2023-11-06 DIAGNOSIS — I10 Essential (primary) hypertension: Secondary | ICD-10-CM

## 2024-01-30 ENCOUNTER — Other Ambulatory Visit: Payer: Self-pay | Admitting: Emergency Medicine

## 2024-01-30 DIAGNOSIS — I10 Essential (primary) hypertension: Secondary | ICD-10-CM

## 2024-04-25 ENCOUNTER — Other Ambulatory Visit: Payer: Self-pay | Admitting: Emergency Medicine

## 2024-04-25 DIAGNOSIS — I10 Essential (primary) hypertension: Secondary | ICD-10-CM

## 2024-05-12 ENCOUNTER — Ambulatory Visit: Payer: Self-pay | Admitting: Emergency Medicine

## 2024-05-12 ENCOUNTER — Encounter: Payer: Self-pay | Admitting: Emergency Medicine

## 2024-05-12 VITALS — BP 128/80 | HR 61 | Temp 98.5°F | Ht 63.0 in | Wt 251.0 lb

## 2024-05-12 DIAGNOSIS — I10 Essential (primary) hypertension: Secondary | ICD-10-CM

## 2024-05-12 LAB — CBC WITH DIFFERENTIAL/PLATELET
Basophils Absolute: 0.1 K/uL (ref 0.0–0.1)
Basophils Relative: 1.1 % (ref 0.0–3.0)
Eosinophils Absolute: 0.1 K/uL (ref 0.0–0.7)
Eosinophils Relative: 1.1 % (ref 0.0–5.0)
HCT: 38.1 % (ref 36.0–46.0)
Hemoglobin: 13.2 g/dL (ref 12.0–15.0)
Lymphocytes Relative: 18.5 % (ref 12.0–46.0)
Lymphs Abs: 1.6 K/uL (ref 0.7–4.0)
MCHC: 34.6 g/dL (ref 30.0–36.0)
MCV: 88.4 fl (ref 78.0–100.0)
Monocytes Absolute: 0.7 K/uL (ref 0.1–1.0)
Monocytes Relative: 8.4 % (ref 3.0–12.0)
Neutro Abs: 6.1 K/uL (ref 1.4–7.7)
Neutrophils Relative %: 70.9 % (ref 43.0–77.0)
Platelets: 299 K/uL (ref 150.0–400.0)
RBC: 4.3 Mil/uL (ref 3.87–5.11)
RDW: 13.4 % (ref 11.5–15.5)
WBC: 8.6 K/uL (ref 4.0–10.5)

## 2024-05-12 LAB — COMPREHENSIVE METABOLIC PANEL WITH GFR
ALT: 53 U/L — ABNORMAL HIGH (ref 0–35)
AST: 39 U/L — ABNORMAL HIGH (ref 0–37)
Albumin: 4.4 g/dL (ref 3.5–5.2)
Alkaline Phosphatase: 54 U/L (ref 39–117)
BUN: 11 mg/dL (ref 6–23)
CO2: 27 meq/L (ref 19–32)
Calcium: 9.3 mg/dL (ref 8.4–10.5)
Chloride: 102 meq/L (ref 96–112)
Creatinine, Ser: 0.53 mg/dL (ref 0.40–1.20)
GFR: 109.71 mL/min (ref >60.00)
Glucose, Bld: 90 mg/dL (ref 70–99)
Potassium: 3.7 meq/L (ref 3.5–5.1)
Sodium: 138 meq/L (ref 135–145)
Total Bilirubin: 1.8 mg/dL — ABNORMAL HIGH (ref 0.2–1.2)
Total Protein: 7.4 g/dL (ref 6.0–8.3)

## 2024-05-12 LAB — HEMOGLOBIN A1C: Hgb A1c MFr Bld: 5.9 % (ref 4.6–6.5)

## 2024-05-12 LAB — LIPID PANEL
Cholesterol: 143 mg/dL (ref 0–200)
HDL: 46.8 mg/dL (ref >39.00)
LDL Cholesterol: 77 mg/dL (ref 0–99)
NonHDL: 96.26
Total CHOL/HDL Ratio: 3
Triglycerides: 94 mg/dL (ref 0.0–149.0)
VLDL: 18.8 mg/dL (ref 0.0–40.0)

## 2024-05-12 MED ORDER — LABETALOL HCL 100 MG PO TABS
100.0000 mg | ORAL_TABLET | Freq: Two times a day (BID) | ORAL | 3 refills | Status: AC
Start: 2024-05-12 — End: ?

## 2024-05-12 MED ORDER — LOSARTAN POTASSIUM-HCTZ 100-12.5 MG PO TABS: 1.0000 | ORAL_TABLET | Freq: Every day | ORAL | 3 refills | Status: AC

## 2024-05-12 NOTE — Progress Notes (Signed)
 Kristen Madden 48 y.o.   Chief Complaint  Patient presents with   Follow-up    114/71,119/63,113/67,129/73,127/62 these are the reading within the last few days     HISTORY OF PRESENT ILLNESS: This is a 48 y.o. female here for follow-up of hypertension Overall doing well. Has no complaints or medical concerns today.  HPI   Prior to Admission medications   Medication Sig Start Date End Date Taking? Authorizing Provider  labetalol  (NORMODYNE ) 100 MG tablet Take 1 tablet by mouth twice daily 04/25/24  Yes Keyshaun Exley, Emil Schanz, MD  losartan -hydrochlorothiazide  [facility]) 100-12.5 MG tablet Take 1 tablet by mouth once daily 01/31/24  Yes Rosalie Gelpi, Emil Schanz, MD    No Known Allergies  Patient Active Problem List   Diagnosis Date Noted   Body mass index (BMI) of 40.1-44.9 in adult [facility]) 06/05/2021   Morbid obesity (HCC) 07/21/2018   Adenomyosis 07/21/2018   HTN (hypertension) 06/28/2013    Past Medical History:  Diagnosis Date   Hypertension    Ovarian cyst     Past Surgical History:  Procedure Laterality Date   CHOLECYSTECTOMY N/A 05/16/2014   Procedure: LAPAROSCOPIC CHOLECYSTECTOMY WITH INTRAOPERATIVE CHOLANGIOGRAM;  Surgeon: Camellia CHRISTELLA Blush, MD;  Location: [facility] OR;  Service: General;  Laterality: N/A;   NO PAST SURGERIES      Social History   Socioeconomic History   Marital status: Married    Spouse name: Not on file   Number of children: Not on file   Years of education: Not on file   Highest education level: Not on file  Occupational History   Not on file  Tobacco Use   Smoking status: Never   Smokeless tobacco: Never  Vaping Use   Vaping status: Never Used  Substance and Sexual Activity   Alcohol use: No   Drug use: No   Sexual activity: Yes    Birth control/protection: Condom  Other Topics Concern   Not on file  Social History Narrative   Not on file   Social Drivers of Health   Financial Resource Strain: Not on file  Food Insecurity: Not  on file  Transportation Needs: Not on file  Physical Activity: Not on file  Stress: Not on file  Social Connections: Not on file  Intimate Partner Violence: Not on file    Family History  Problem Relation Age of Onset   Hypertension Mother      Review of Systems  Constitutional: Negative.  Negative for chills and fever.  HENT: Negative.  Negative for congestion and sore throat.   Respiratory: Negative.  Negative for cough and shortness of breath.   Cardiovascular: Negative.  Negative for chest pain and palpitations.  Gastrointestinal:  Negative for abdominal pain, diarrhea, nausea and vomiting.  Genitourinary: Negative.  Negative for dysuria and hematuria.  Skin: Negative.  Negative for rash.  Neurological: Negative.  Negative for dizziness and headaches.  All other systems reviewed and are negative.   Vitals:   05/12/24 1039  BP: 128/80  Pulse: 61  Temp: 98.5 F (36.9 C)  SpO2: 99%    Physical Exam Vitals reviewed.  Constitutional:      Appearance: Normal appearance.  HENT:     Head: Normocephalic.     Mouth/Throat:     Mouth: Mucous membranes are moist.     Pharynx: Oropharynx is clear.  Eyes:     Extraocular Movements: Extraocular movements intact.     Pupils: Pupils are equal, round, and reactive to light.  Cardiovascular:  Rate and Rhythm: Normal rate and regular rhythm.     Pulses: Normal pulses.     Heart sounds: Normal heart sounds.  Pulmonary:     Effort: Pulmonary effort is normal.     Breath sounds: Normal breath sounds.  Abdominal:     Palpations: Abdomen is soft.     Tenderness: There is no abdominal tenderness.  Musculoskeletal:     Cervical back: No tenderness.  Lymphadenopathy:     Cervical: No cervical adenopathy.  Skin:    General: Skin is warm and dry.     Capillary Refill: Capillary refill takes less than 2 seconds.  Neurological:     General: No focal deficit present.     Mental Status: She is alert and oriented to person,  place, and time.  Psychiatric:        Mood and Affect: Mood normal.        Behavior: Behavior normal.      ASSESSMENT & PLAN: Problem List Items Addressed This Visit       Cardiovascular and Mediastinum   HTN (hypertension) - Primary   BP Readings from Last 3 Encounters:  05/12/24 128/80  05/18/23 116/70  11/17/22 136/84  Well-controlled hypertension with normal blood pressure readings at home Cardiovascular risks associated with hypertension discussed Benefit of exercise discussed Continue labetalol  100 mg twice a day and Hyzaar 100-12.5 mg daily Diet and nutrition discussed       Relevant Medications   losartan -hydrochlorothiazide  (HYZAAR) 100-12.5 MG tablet   labetalol  (NORMODYNE ) 100 MG tablet     Other   Morbid obesity (HCC)   Eating better and losing weight Advised to decrease amount of daily carbohydrate intake and daily calories and increase amount of plant-based protein in her diet Benefits of exercise discussed      Other Visit Diagnoses       Essential hypertension       Relevant Medications   losartan -hydrochlorothiazide  (HYZAAR) 100-12.5 MG tablet   labetalol  (NORMODYNE ) 100 MG tablet   Other Relevant Orders   CBC with Differential/Platelet   Comprehensive metabolic panel with GFR   Hemoglobin A1c   Lipid panel      Patient Instructions  Mantenimiento de la salud en las mujeres Health Maintenance, Female Adoptar un estilo de vida saludable y recibir atencin preventiva son importantes para promover la salud y counsellor. Consulte al mdico sobre: El esquema adecuado para hacerse pruebas y exmenes peridicos. Cosas que puede hacer por su cuenta para prevenir enfermedades y [place] sano. Qu debo saber sobre la dieta, el peso y el ejercicio? Consuma una dieta saludable  Consuma una dieta que incluya muchas verduras, frutas, productos lcteos con bajo contenido de grasa y protenas magras. No consuma muchos alimentos ricos en grasas  slidas, azcares agregados o sodio. Mantenga un peso saludable El ndice de masa muscular [facility]) se utiliza para identificar problemas de [place]. Proporciona una estimacin de la grasa corporal basndose en el peso y la altura. Su mdico puede ayudarle a determinar su IMC y a personnel officer o pharmacologist un peso saludable. Haga ejercicio con regularidad Haga ejercicio con regularidad. Esta es una de las prcticas ms importantes que puede hacer por su salud. La [employer] de los adultos deben seguir estas pautas: Education Officer, Environmental, al menos, 150 minutos de actividad fsica por semana. El ejercicio debe aumentar la frecuencia cardaca y media planner transpirar (ejercicio de intensidad moderada). Hacer ejercicios de fortalecimiento por lo [facility] por semana. Agregue esto a su plan de ejercicio de intensidad  moderada. Pase menos tiempo sentada. Incluso la actividad fsica ligera puede ser beneficiosa. Controle sus niveles de colesterol y lpidos en la sangre Comience a realizarse anlisis de lpidos y oncologist en la sangre a los 20 aos y luego reptalos cada 5 aos. Hgase controlar los niveles de colesterol con mayor frecuencia si: Sus niveles de lpidos y colesterol son altos. Es mayor de 40 aos. Presenta un alto riesgo de padecer enfermedades cardacas. Qu debo saber sobre las pruebas de deteccin del cncer? Segn su historia clnica y sus antecedentes familiares, es posible que deba realizarse pruebas de deteccin del cncer en diferentes edades. Esto puede incluir pruebas de deteccin de lo siguiente: Cncer de mama. Cncer de cuello uterino. Cncer colorrectal. Cncer de piel. Cncer de pulmn. Qu debo saber sobre la enfermedad cardaca, la diabetes y la hipertensin arterial? Presin arterial y enfermedad cardaca La hipertensin arterial causa enfermedades cardacas y aumenta el riesgo de accidente cerebrovascular. Es ms probable que esto se manifieste en las personas que tienen lecturas de presin  arterial alta o tienen sobrepeso. Hgase controlar la presin arterial: Cada 3 a 5 aos si tiene entre 18 y 6 aos. Todos los aos si es mayor de 40 aos. Diabetes Realcese exmenes de deteccin de la diabetes con regularidad. Este anlisis revisa el nivel de azcar en la sangre en [place]. Hgase las pruebas de deteccin: Cada tres aos despus de los 40 aos de edad si tiene un peso normal y un bajo riesgo de padecer diabetes. Con ms frecuencia y a partir de [place] edad inferior si tiene sobrepeso o un alto riesgo de padecer diabetes. Qu debo saber sobre la prevencin de infecciones? Hepatitis B Si tiene un riesgo ms alto de contraer hepatitis B, debe someterse a un examen de deteccin de este virus. Hable con el mdico para averiguar si tiene riesgo de contraer la infeccin por hepatitis B. Hepatitis C Se recomienda el anlisis a: [employer] 1945 y 1965. Todas las personas que tengan un riesgo de haber contrado hepatitis C. Enfermedades de transmisin sexual (ETS) Hgase las pruebas de airline pilot de ITS, incluidas la gonorrea y la clamidia, si: Es sexualmente activa y es menor de [address]. Es mayor de [address], y public affairs consultant informa que corre riesgo de tener este tipo de infecciones. La actividad sexual ha cambiado desde que le hicieron la ltima prueba de deteccin y tiene un riesgo mayor de tener clamidia o copy. Pregntele al mdico si usted tiene riesgo. Pregntele al mdico si usted tiene un alto riesgo de primary school teacher VIH. El mdico tambin puede recomendarle un medicamento recetado para ayudar a evitar la infeccin por el VIH. Si elige tomar medicamentos para prevenir el VIH, primero debe oneok de deteccin del VIH. Luego debe hacerse anlisis cada 3 meses mientras est tomando los medicamentos. Embarazo Si est por dejar de menstruar (fase premenopusica) y usted puede quedar embarazada, busque asesoramiento antes de quedar embarazada. Tome de 400 a  800 microgramos (mcg) de cido flico todos los das si queda embarazada. Pida mtodos de control de la natalidad (anticonceptivos) si desea evitar un embarazo no deseado. Osteoporosis y menopausia La osteoporosis es una enfermedad en la que los huesos pierden los minerales y la fuerza por el avance de la edad. El resultado pueden ser fracturas en los [place]. Si tiene 65 aos o ms, o si est en riesgo de sufrir osteoporosis y fracturas, pregunte a su mdico si debe: Hacerse pruebas de deteccin de prdida sea.  Tomar un suplemento de calcio o de vitamina D para reducir el riesgo de fracturas. Recibir terapia de reemplazo hormonal (TRH) para tratar los sntomas de la menopausia. Siga estas indicaciones en su casa: Consumo de alcohol No beba alcohol si: Su mdico le indica no hacerlo. Est embarazada, puede estar embarazada o est tratando de quedar embarazada. Si bebe alcohol: Limite la cantidad que bebe a lo siguiente: De 0 a 1 bebida por da. Sepa cunta cantidad de alcohol hay en las bebidas que toma. En los [address], una medida equivale a una botella de cerveza de 12 oz (355 ml), un vaso de vino de 5 oz (148 ml) o un vaso de una bebida alcohlica de alta graduacin de 1 oz (44 ml). Estilo de vida No consuma ningn producto que contenga nicotina o tabaco. Estos productos incluyen cigarrillos, tabaco para theatre manager y aparatos de vapeo, como los cigarrillos electrnicos. Si necesita ayuda para dejar de consumir estos productos, consulte al mdico. No consuma drogas. No comparta agujas. Solicite ayuda a su mdico si necesita apoyo o informacin para abandonar las drogas. Indicaciones generales Realcese los estudios de rutina de [address], dentales y de wellsite geologist. Mantngase al da con las vacunas. Infrmele a su mdico si: Se siente deprimida con frecuencia. Alguna vez ha sido vctima de [place] o no se siente seguro en su casa. Resumen Adoptar un estilo de vida saludable y recibir  atencin preventiva son importantes para promover la salud y counsellor. Siga las instrucciones del mdico acerca de una dieta saludable, el ejercicio y la realizacin de pruebas o exmenes para hotel manager. Siga las instrucciones del mdico con respecto al control del colesterol y la presin arterial. Esta informacin no tiene theme park manager el consejo del mdico. Asegrese de hacerle al mdico cualquier pregunta que tenga. Document Revised: 11/29/2020 Document Reviewed: 11/29/2020 Elsevier Patient Education  2024 Elsevier Inc.    Emil Schaumann, MD [facility]

## 2024-05-12 NOTE — Patient Instructions (Signed)
 Mantenimiento de Radiographer, therapeutic en las mujeres Health Maintenance, Female Adoptar un estilo de vida saludable y recibir atencin preventiva son importantes para promover la salud y Counsellor. Consulte al mdico sobre: El esquema adecuado para hacerse pruebas y exmenes peridicos. Cosas que puede hacer por su cuenta para prevenir enfermedades y [place] sano. Qu debo saber sobre la dieta, el peso y el ejercicio? Consuma una dieta saludable  Consuma una dieta que incluya muchas verduras, frutas, productos lcteos con bajo contenido de [place] y Associate Professor. No consuma muchos alimentos ricos en grasas slidas, azcares agregados o sodio. Mantenga un peso saludable El ndice de masa muscular [facility]) se [place] para identificar problemas de [place]. Proporciona una estimacin de la grasa corporal basndose en el peso y la altura. Su mdico puede ayudarle a Engineer, site IMC y a Personnel officer o Pharmacologist un peso saludable. Haga ejercicio con regularidad Haga ejercicio con regularidad. Esta es una de las prcticas ms importantes que puede hacer por su salud. La [employer] de los adultos deben seguir estas pautas: Education officer, environmental, al menos, 150 minutos de actividad fsica por semana. El ejercicio debe aumentar la frecuencia cardaca y Media planner transpirar (ejercicio de intensidad moderada). Hacer ejercicios de fortalecimiento por lo [facility] por semana. Agregue esto a su plan de ejercicio de intensidad moderada. Pase menos tiempo sentada. Incluso la actividad fsica ligera puede ser beneficiosa. Controle sus niveles de colesterol y lpidos en la sangre Comience a realizarse anlisis de lpidos y Oncologist en la sangre a los 20 aos y luego reptalos cada 5 aos. Hgase controlar los niveles de colesterol con mayor frecuencia si: Sus niveles de lpidos y colesterol son altos. Es mayor de 40 aos. Presenta un alto riesgo de padecer enfermedades cardacas. Qu debo saber sobre las pruebas de deteccin del  cncer? Segn su historia clnica y sus antecedentes familiares, es posible que deba realizarse pruebas de deteccin del cncer en diferentes edades. Esto puede incluir pruebas de deteccin de lo siguiente: Cncer de mama. Cncer de cuello uterino. Cncer colorrectal. Cncer de piel. Cncer de pulmn. Qu debo saber sobre la enfermedad cardaca, la diabetes y la hipertensin arterial? Presin arterial y enfermedad cardaca La hipertensin arterial causa enfermedades cardacas y [place] el riesgo de accidente cerebrovascular. Es ms probable que esto se manifieste en las personas que tienen lecturas de presin arterial alta o tienen sobrepeso. Hgase controlar la presin arterial: Cada 3 a 5 aos si tiene entre 18 y 50 aos. Todos los aos si es mayor de 40 aos. Diabetes Realcese exmenes de deteccin de la diabetes con regularidad. Este anlisis revisa el nivel de azcar en la sangre en [place]. Hgase las pruebas de deteccin: Cada tres aos despus de los 40 aos de edad si tiene un peso normal y un bajo riesgo de padecer diabetes. Con ms frecuencia y a partir de [place] edad inferior si tiene sobrepeso o un alto riesgo de padecer diabetes. Qu debo saber sobre la prevencin de infecciones? Hepatitis B Si tiene un riesgo ms alto de contraer hepatitis B, debe someterse a un examen de deteccin de este virus. Hable con el mdico para averiguar si tiene riesgo de contraer la infeccin por hepatitis B. Hepatitis C Se recomienda el anlisis a: [employer] 1945 y 1965. Todas las personas que tengan un riesgo de haber contrado hepatitis C. Enfermedades de transmisin sexual (ETS) Hgase las pruebas de Airline pilot de ITS, incluidas la gonorrea y la clamidia, si: Es sexualmente activa y es Adult nurse de 24  aos. Es mayor de 24 aos, y el mdico le informa que corre riesgo de tener este tipo de infecciones. La actividad sexual ha cambiado desde que le hicieron la ltima prueba de  deteccin y tiene un riesgo mayor de Warehouse manager clamidia o Copy. Pregntele al mdico si usted tiene riesgo. Pregntele al mdico si usted tiene un alto riesgo de Primary school teacher VIH. El mdico tambin puede recomendarle un medicamento recetado para ayudar a evitar la infeccin por el VIH. Si elige tomar medicamentos para prevenir el VIH, primero debe [employer] de deteccin del VIH. Luego debe hacerse anlisis cada 3 meses mientras est tomando los medicamentos. Embarazo Si est por dejar de Armed forces training and education officer (fase premenopusica) y usted puede quedar [place], busque asesoramiento antes de [place]. Tome de 400 a 800 microgramos (mcg) de cido [employer] si [place]. Pida mtodos de control de la natalidad (anticonceptivos) si desea evitar un embarazo no deseado. Osteoporosis y [place] La osteoporosis es una enfermedad en la que los huesos pierden los minerales y la fuerza por el avance de la edad. El resultado pueden ser fracturas en los [place]. Si tiene 65 aos o ms, o si est en riesgo de sufrir osteoporosis y fracturas, pregunte a su mdico si debe: Hacerse pruebas de deteccin de prdida sea. Tomar un suplemento de calcio o de vitamina D para reducir el riesgo de fracturas. Recibir terapia de reemplazo hormonal (TRH) para tratar los sntomas de la menopausia. Siga estas indicaciones en su casa: Consumo de alcohol No beba alcohol si: Su mdico le indica no hacerlo. Est embarazada, puede estar embarazada o est tratando de [place]. Si bebe alcohol: Limite la cantidad que bebe a lo siguiente: De 0 a 1 bebida por da. Sepa cunta cantidad de alcohol hay en las bebidas que toma. En los [address], una medida equivale a una botella de cerveza de 12 oz (355 ml), un vaso de vino de 5 oz (148 ml) o un vaso de una bebida alcohlica de alta graduacin de 1 oz (44 ml). Estilo de vida No consuma ningn producto que contenga nicotina o tabaco. Estos  productos incluyen cigarrillos, tabaco para Theatre manager y aparatos de vapeo, como los Administrator, Civil Service. Si necesita ayuda para dejar de consumir estos productos, consulte al mdico. No consuma drogas. No comparta agujas. Solicite ayuda a su mdico si necesita apoyo o informacin para abandonar las drogas. Indicaciones generales Realcese los estudios de rutina de [address], dentales y de Wellsite geologist. Mantngase al da con las vacunas. Infrmele a su mdico si: Se siente deprimida con frecuencia. Alguna vez ha sido vctima de [place] o no se siente seguro en su casa. Resumen Adoptar un estilo de vida saludable y recibir atencin preventiva son importantes para promover la salud y Counsellor. Siga las instrucciones del mdico acerca de una dieta saludable, el ejercicio y la realizacin de pruebas o exmenes para Hotel manager. Siga las instrucciones del mdico con respecto al control del colesterol y la presin arterial. Esta informacin no tiene Theme park manager el consejo del mdico. Asegrese de hacerle al mdico cualquier pregunta que tenga. Document Revised: 11/29/2020 Document Reviewed: 11/29/2020 Elsevier Patient Education  2024 [employer].

## 2024-05-12 NOTE — Assessment & Plan Note (Signed)
 BP Readings from Last 3 Encounters:  05/12/24 128/80  05/18/23 116/70  11/17/22 136/84  Well-controlled hypertension with normal blood pressure readings at home Cardiovascular risks associated with hypertension discussed Benefit of exercise discussed Continue labetalol  100 mg twice a day and Hyzaar 100-12.5 mg daily Diet and nutrition discussed

## 2024-05-12 NOTE — Assessment & Plan Note (Signed)
 Eating better and losing weight Advised to decrease amount of daily carbohydrate intake and daily calories and increase amount of plant-based protein in her diet Benefits of exercise discussed

## 2024-05-16 ENCOUNTER — Ambulatory Visit: Payer: Self-pay | Admitting: Emergency Medicine
